# Patient Record
Sex: Male | Born: 1980 | Race: Black or African American | Hispanic: No | Marital: Single | Smoking: Current every day smoker
Health system: Southern US, Community
[De-identification: ages and names within clinical notes are randomized; demographics above are authoritative.]

## PROBLEM LIST (undated history)

## (undated) DIAGNOSIS — I2699 Other pulmonary embolism without acute cor pulmonale: Secondary | ICD-10-CM

## (undated) DIAGNOSIS — I82409 Acute embolism and thrombosis of unspecified deep veins of unspecified lower extremity: Secondary | ICD-10-CM

## (undated) HISTORY — DX: Other pulmonary embolism without acute cor pulmonale: I26.99

## (undated) HISTORY — DX: Acute embolism and thrombosis of unspecified deep veins of unspecified lower extremity: I82.409

---

## 2016-01-14 ENCOUNTER — Emergency Department: Payer: Self-pay

## 2016-01-14 ENCOUNTER — Encounter: Payer: Self-pay | Admitting: Emergency Medicine

## 2016-01-14 ENCOUNTER — Emergency Department
Admission: EM | Admit: 2016-01-14 | Discharge: 2016-01-14 | Disposition: A | Discharge status: Home / Self Care | Payer: Self-pay | Attending: Emergency Medicine | Admitting: Emergency Medicine

## 2016-01-14 DIAGNOSIS — F17200 Nicotine dependence, unspecified, uncomplicated: Secondary | ICD-10-CM | POA: Insufficient documentation

## 2016-01-14 DIAGNOSIS — G8929 Other chronic pain: Secondary | ICD-10-CM | POA: Insufficient documentation

## 2016-01-14 DIAGNOSIS — R51 Headache: Secondary | ICD-10-CM | POA: Insufficient documentation

## 2016-01-14 DIAGNOSIS — F0781 Postconcussional syndrome: Secondary | ICD-10-CM | POA: Insufficient documentation

## 2016-01-14 LAB — COMPREHENSIVE METABOLIC PANEL
ALK PHOS: 64 U/L (ref 38–126)
ALT: 15 U/L — ABNORMAL LOW (ref 17–63)
ANION GAP: 7 (ref 5–15)
AST: 19 U/L (ref 15–41)
Albumin: 4.2 g/dL (ref 3.5–5.0)
BILIRUBIN TOTAL: 0.7 mg/dL (ref 0.3–1.2)
BUN: 14 mg/dL (ref 6–20)
CALCIUM: 8.7 mg/dL — AB (ref 8.9–10.3)
CO2: 28 mmol/L (ref 22–32)
Chloride: 105 mmol/L (ref 101–111)
Creatinine, Ser: 1.39 mg/dL — ABNORMAL HIGH (ref 0.61–1.24)
GFR calc Af Amer: >60 mL/min (ref >60)
Glucose, Bld: 97 mg/dL (ref 65–99)
POTASSIUM: 3.9 mmol/L (ref 3.5–5.1)
Sodium: 140 mmol/L (ref 135–145)
TOTAL PROTEIN: 7.2 g/dL (ref 6.5–8.1)

## 2016-01-14 LAB — CBC WITH DIFFERENTIAL/PLATELET
Basophils Absolute: 0.1 10*3/uL (ref 0–0.1)
Basophils Relative: 2 %
Eosinophils Absolute: 0.2 10*3/uL (ref 0–0.7)
Eosinophils Relative: 4 %
HEMATOCRIT: 42.7 % (ref 40.0–52.0)
HEMOGLOBIN: 14.1 g/dL (ref 13.0–18.0)
LYMPHS ABS: 1.7 10*3/uL (ref 1.0–3.6)
LYMPHS PCT: 24 %
MCH: 29.1 pg (ref 26.0–34.0)
MCHC: 33.1 g/dL (ref 32.0–36.0)
MCV: 88 fL (ref 80.0–100.0)
MONO ABS: 0.5 10*3/uL (ref 0.2–1.0)
MONOS PCT: 7 %
NEUTROS ABS: 4.4 10*3/uL (ref 1.4–6.5)
Neutrophils Relative %: 63 %
Platelets: 198 10*3/uL (ref 150–440)
RBC: 4.86 MIL/uL (ref 4.40–5.90)
RDW: 12.7 % (ref 11.5–14.5)
WBC: 6.9 10*3/uL (ref 3.8–10.6)

## 2016-01-14 MED ORDER — ACETAMINOPHEN 325 MG PO TABS
650.0000 mg | ORAL_TABLET | Freq: Once | ORAL | Status: AC
  Administered 2016-01-14: 650 mg via ORAL
  Filled 2016-01-14: qty 2

## 2016-01-14 MED ORDER — SODIUM CHLORIDE 0.9 % IV BOLUS (SEPSIS)
1000.0000 mL | Freq: Once | INTRAVENOUS | Status: AC
  Administered 2016-01-14: 1000 mL via INTRAVENOUS

## 2016-01-14 MED ORDER — METOCLOPRAMIDE HCL 5 MG/ML IJ SOLN
10.0000 mg | Freq: Once | INTRAMUSCULAR | Status: AC
  Administered 2016-01-14: 10 mg via INTRAVENOUS
  Filled 2016-01-14: qty 2
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
## 2016-01-14 NOTE — ED Notes (Signed)
Pt reports headaches x3 weeks; reports frontal headache. Pt reports pain now radiates down spine. Pt denies hx of migraines.

## 2016-01-14 NOTE — ED Notes (Signed)
Pt reports that he has had headache for 3 weeks that "will let up" sometimes but has basically been constant. States that it is usually a "normal headache" (aching), and that a couple of times each hour he has sharp, knife-like pains in the top of his head. Pt has been taking aleve daily for it with some relief (dulls it some), but it continues. Pt alert & oriented. NAD noted.

## 2016-06-06 ENCOUNTER — Emergency Department: Payer: Self-pay

## 2016-06-06 ENCOUNTER — Encounter: Payer: Self-pay | Admitting: Emergency Medicine

## 2016-06-06 ENCOUNTER — Emergency Department
Admission: EM | Admit: 2016-06-06 | Discharge: 2016-06-06 | Disposition: A | Discharge status: Home / Self Care | Payer: Self-pay | Attending: Emergency Medicine | Admitting: Emergency Medicine

## 2016-06-06 DIAGNOSIS — F17200 Nicotine dependence, unspecified, uncomplicated: Secondary | ICD-10-CM | POA: Insufficient documentation

## 2016-06-06 DIAGNOSIS — X501XXA Overexertion from prolonged static or awkward postures, initial encounter: Secondary | ICD-10-CM | POA: Insufficient documentation

## 2016-06-06 DIAGNOSIS — S93402A Sprain of unspecified ligament of left ankle, initial encounter: Secondary | ICD-10-CM | POA: Insufficient documentation

## 2016-06-06 DIAGNOSIS — Y9367 Activity, basketball: Secondary | ICD-10-CM | POA: Insufficient documentation

## 2016-06-06 DIAGNOSIS — Y929 Unspecified place or not applicable: Secondary | ICD-10-CM | POA: Insufficient documentation

## 2016-06-06 DIAGNOSIS — Y998 Other external cause status: Secondary | ICD-10-CM | POA: Insufficient documentation

## 2016-06-06 MED ORDER — IBUPROFEN 600 MG PO TABS: 600.0000 mg | ORAL_TABLET | Freq: Three times a day (TID) | ORAL | 0 refills | Status: DC | PRN

## 2016-06-06 MED ORDER — HYDROCODONE-ACETAMINOPHEN 5-325 MG PO TABS: 1.0000 | ORAL_TABLET | ORAL | 0 refills | Status: DC | PRN

## 2016-06-06 MED ORDER — HYDROCODONE-ACETAMINOPHEN 5-325 MG PO TABS
1.0000 | ORAL_TABLET | Freq: Once | ORAL | Status: AC
  Administered 2016-06-06: 1 via ORAL
  Filled 2016-06-06: qty 1

## 2016-06-06 NOTE — ED Triage Notes (Signed)
Brought in via ems s/p left ankle injury  staes he rolled ankle while playing b/b

## 2016-06-06 NOTE — ED Provider Notes (Signed)
[facility] Emergency Department Provider Note  ____________________________________________  Time seen: Approximately 11:33 AM  I have reviewed the triage vital signs and the nursing notes.   HISTORY  Chief Complaint Ankle Pain   HPI Sergio Ramirez is a 35 y.o. male comes in to the emergency room today via EMS for an ankle injury that occurred yesterday. Patient states that he was able to walk after he turned his ankle over playing basketball. This morning he was unable to put any pressure on his foot without severe pain. Patient did not take any over-the-counter medications last evening or this morning.Patient denies any previous injury to his ankle. Currently he rates his pain a 5 out of 10. He denies any head injury or loss of consciousness during this event.   History reviewed. No pertinent past medical history.  There are no active problems to display for this patient.   History reviewed. No pertinent surgical history.    Allergies Review of patient's allergies indicates no known allergies.  No family history on file.  Social History Social History  Substance Use Topics  . Smoking status: Current Every Day Smoker  . Smokeless tobacco: Never Used  . Alcohol use No    Review of Systems Constitutional: No fever/chills Eyes: No visual changes. ENT: No trauma Cardiovascular: Denies chest pain. Respiratory: Denies shortness of breath. Gastrointestinal:   No nausea, no vomiting.   Musculoskeletal: Negative for back pain. Positive left ankle pain. Skin: Negative for abrasions Neurological: Negative for headaches, focal weakness or numbness.  10-point ROS otherwise negative.  ____________________________________________   PHYSICAL EXAM:  VITAL SIGNS: ED Triage Vitals  Enc Vitals Group     BP 06/06/16 1132 137/87     Pulse Rate 06/06/16 1132 99     Resp 06/06/16 1132 18     Temp 06/06/16 1132 99 F (37.2 C)     Temp Source  06/06/16 1132 Oral     SpO2 06/06/16 1132 96 %     Weight 06/06/16 1132 250 lb (113.4 kg)     Height 06/06/16 1132  (1.93 m)     Head Circumference --      Peak Flow --      Pain Score 06/06/16 1133 5     Pain Loc --      Pain Edu? --      Excl. in GC? --     Constitutional: Alert and oriented. Well appearing and in no acute distress. Eyes: Conjunctivae are normal. PERRL. EOMI. Head: Atraumatic. Nose: No congestion/rhinnorhea. Neck: No stridor.   Cardiovascular: Normal rate, regular rhythm. Grossly normal heart sounds.  Good peripheral circulation. Respiratory: Normal respiratory effort.  No retractions. Lungs CTAB. Musculoskeletal: Moves upper extremities without any difficulty. On examination of left ankle there is some soft tissue swelling on the lateral aspect and range of motion is restricted secondary to patient's discomfort. There is no abrasions or ecchymosis present. Pulses present. Motor sensory function intact. Neurologic:  Normal speech and language. No gross focal neurologic deficits are appreciated. Gait was not tested secondary to patient inability to bear weight. Skin:  Skin is warm, dry and intact. No rash noted. Psychiatric: Mood and affect are normal. Speech and behavior are normal.  ____________________________________________   LABS (all labs ordered are listed, but only abnormal results are displayed)  Labs Reviewed - No data to display  RADIOLOGY  Left ankle per radiologist shows no acute bony abnormality. ____________________________________________   PROCEDURES  Procedure(s) performed: None  Procedures  Critical Care performed: No  ____________________________________________   INITIAL IMPRESSION / ASSESSMENT AND PLAN / ED COURSE  Pertinent labs & imaging results that were available during my care of the patient were reviewed by me and considered in my medical decision making (see chart for details).    Clinical Course  Patient was  given Norco while in the emergency room since he is not driving. He is aware that he does not have fracture or dislocation of his ankle after coming back from radiology. Patient was placed in a ankle stirrup splint and given crutches. He is to follow-up with Dr. Joice Lofts if not improving in one week. He is encouraged to ice and elevate as needed for swelling. Patient does not have a PCP to follow-up with.  ____________________________________________   FINAL CLINICAL IMPRESSION(S) / ED DIAGNOSES  Final diagnoses:  Left ankle sprain, initial encounter      NEW MEDICATIONS STARTED DURING THIS VISIT:  New Prescriptions   HYDROCODONE-ACETAMINOPHEN (NORCO/VICODIN) 5-325 MG TABLET    Take 1 tablet by mouth every 4 (four) hours as needed for moderate pain.   IBUPROFEN (ADVIL,MOTRIN) 600 MG TABLET    Take 1 tablet (600 mg total) by mouth every 8 (eight) hours as needed.     Note:  This document was prepared using Dragon voice recognition software and may include unintentional dictation errors.    Tommi Rumps, PA-C 06/06/16 1228    Nita Sickle, MD 06/06/16 [phone]

## 2016-06-06 NOTE — Discharge Instructions (Signed)
Take medication only as directed. Norco was for severe pain and should be taken with food. This medication could cause drowsiness and therefore he should not be driving or operating machinery when taking this. Ibuprofen also is to be taken with food this medication will not cause drowsiness. Follow-up with Dr. Joice Lofts if any continued problems with your ankle. Ice and elevation is still recommended to reduce swelling. Use crutches when walking.

## 2017-09-02 ENCOUNTER — Emergency Department
Admission: EM | Admit: 2017-09-02 | Discharge: 2017-09-02 | Discharge status: Against Medical Advice | Payer: Self-pay | Attending: Emergency Medicine | Admitting: Emergency Medicine

## 2017-09-02 DIAGNOSIS — Z5321 Procedure and treatment not carried out due to patient leaving prior to being seen by health care provider: Secondary | ICD-10-CM | POA: Insufficient documentation

## 2017-09-02 DIAGNOSIS — R51 Headache: Secondary | ICD-10-CM | POA: Insufficient documentation

## 2017-09-02 NOTE — ED Triage Notes (Signed)
Pt states that for the past week he has been having headaches over his rt eye, pt states that he had been using alleve but now it doesn't relieve his pain, pt states that he tried to take excedrin but he vomited it up, pt states that the pain seems to be worse at night and are causing his rt eye to water, pt reports that he drove himself

## 2017-12-02 ENCOUNTER — Encounter: Payer: Self-pay | Admitting: Emergency Medicine

## 2017-12-02 ENCOUNTER — Emergency Department
Admission: EM | Admit: 2017-12-02 | Discharge: 2017-12-02 | Disposition: A | Discharge status: Home / Self Care | Payer: Self-pay | Attending: Emergency Medicine | Admitting: Emergency Medicine

## 2017-12-02 ENCOUNTER — Other Ambulatory Visit: Payer: Self-pay

## 2017-12-02 ENCOUNTER — Emergency Department: Payer: Self-pay

## 2017-12-02 DIAGNOSIS — I82432 Acute embolism and thrombosis of left popliteal vein: Secondary | ICD-10-CM | POA: Insufficient documentation

## 2017-12-02 DIAGNOSIS — F17210 Nicotine dependence, cigarettes, uncomplicated: Secondary | ICD-10-CM | POA: Insufficient documentation

## 2017-12-02 DIAGNOSIS — I824Z2 Acute embolism and thrombosis of unspecified deep veins of left distal lower extremity: Secondary | ICD-10-CM

## 2017-12-02 MED ORDER — RIVAROXABAN (XARELTO) VTE STARTER PACK (15 & 20 MG): ORAL_TABLET | ORAL | 0 refills | Status: DC

## 2017-12-02 MED ORDER — RIVAROXABAN 15 MG PO TABS
15.0000 mg | ORAL_TABLET | Freq: Once | ORAL | Status: AC
  Administered 2017-12-02: 15 mg via ORAL
  Filled 2017-12-02: qty 1

## 2017-12-02 MED ORDER — APIXABAN 5 MG PO TABS
10.0000 mg | ORAL_TABLET | Freq: Once | ORAL | Status: DC
  Filled 2017-12-02: qty 2

## 2017-12-02 NOTE — ED Provider Notes (Signed)
[facility] Emergency Department Provider Note ____________________________________________  Time seen: Approximately 9:35 PM  I have reviewed the triage vital signs and the nursing notes.   HISTORY  Chief Complaint Leg Pain    HPI Sergio CarasJonathan Ramirez is a 37 y.o. male who presents to the emergency department for evaluation and treatment of left lower extremity pain.  He denies trauma.  Pain started 1-2 weeks ago.  Pain is in the left knee, calf, and extends to the ankle.  He has intermittent swelling that does not seems to be related to activity.  He denies having similar symptoms in the past.  He denies recent travel.  He does smoke cigarettes.  He has had no recent prolonged period of immobilization or surgery.  He has no history of DVT.  History reviewed. No pertinent past medical history.  There are no active problems to display for this patient.   History reviewed. No pertinent surgical history.  Prior to Admission medications   Medication Sig Start Date End Date Taking? Authorizing Provider  HYDROcodone-acetaminophen (NORCO/VICODIN) 5-325 MG tablet Take 1 tablet by mouth every 4 (four) hours as needed for moderate pain. 06/06/16   Tommi RumpsSummers, Rhonda L, PA-C  ibuprofen (ADVIL,MOTRIN) 600 MG tablet Take 1 tablet (600 mg total) by mouth every 8 (eight) hours as needed. 06/06/16   Tommi RumpsSummers, Rhonda L, PA-C  Rivaroxaban 15 & 20 MG TBPK Take as directed on package: Start with one 15mg  tablet by mouth twice a day with food. On Day 22, switch to one 20mg  tablet once a day with food. 12/02/17   Chinita Pesterriplett, Catie Chiao B, FNP    Allergies Patient has no known allergies.  History reviewed. No pertinent family history.  Social History Social History   Tobacco Use  . Smoking status: Current Every Day Smoker    Packs/day: 0.50    Types: Cigarettes  . Smokeless tobacco: Never Used  Substance Use Topics  . Alcohol use: Yes    Comment: occasionally  . Drug use: No    Review  of Systems Constitutional: Negative for recent illness or injury. Cardiovascular: Negative for chest pain Respiratory: Positive for occasional shortness of breath only with exertion and during long conversations Musculoskeletal: Positive for left lower extremity pain Skin: Positive for intermittent swelling over the left lower extremity. Neurological: Negative for paresthesias  ____________________________________________   PHYSICAL EXAM:  VITAL SIGNS: ED Triage Vitals  Enc Vitals Group     BP 12/02/17 1826 (!) 135/91     Pulse Rate 12/02/17 1826 (!) 106     Resp 12/02/17 1826 18     Temp 12/02/17 1826 98.9 F (37.2 C)     Temp Source 12/02/17 1826 Oral     SpO2 12/02/17 1826 97 %     Weight 12/02/17 1827 250 lb (113.4 kg)     Height 12/02/17 1827 6\' 4"  (1.93 m)     Head Circumference --      Peak Flow --      Pain Score 12/02/17 1830 8     Pain Loc --      Pain Edu? --      Excl. in GC? --     Constitutional: Alert and oriented. Well appearing and in no acute distress. Eyes: Conjunctivae are clear without discharge or drainage Head: Atraumatic Neck: Supple Respiratory: Respirations even and unlabored.  Breath sounds clear to auscultation throughout. Musculoskeletal: Positive Homans sign on the left lower extremity. Neurologic: Motor and sensory function intact. Skin: Intact, no edema  of the left lower extremity appreciable on visual exam. Psychiatric: Affect and behavior are appropriate.  ____________________________________________   LABS (all labs ordered are listed, but only abnormal results are displayed)  Labs Reviewed - No data to display ____________________________________________  RADIOLOGY  Vascular ultrasound of the left lower extremity shows an occlusive thrombus of the popliteal vein as well as calf veins. ____________________________________________   PROCEDURES  Procedures  ____________________________________________   INITIAL  IMPRESSION / ASSESSMENT AND PLAN / ED COURSE  Sergio Ramirez is a 37 y.o. male who presents to the emergency department for evaluation and treatment of nontraumatic left lower extremity pain.  Ultrasound is evidence of occlusions in the calf veins as well as popliteal vein.  ----------------------------------------- 10:35 PM on 12/02/2017 -----------------------------------------  Dr. Wyn Quaker consulted and advises outpatient treatment and follow up in the office.  ----------------------------------------- 10:52 PM on 12/02/2017 -----------------------------------------  Test results and importance of adhering to the medication regimen discussed with the patient who voiced understanding. Xarelto packet given along with corresponding prescriptions. He is to call Dr. Driscilla Grammes office in the morning to schedule an appointment. Signs and symptoms of concern were discussed and he was advised to return to the ER if needed.   Medications  Rivaroxaban (XARELTO) tablet 15 mg (15 mg Oral Given 12/02/17 2250)    Pertinent labs & imaging results that were available during my care of the patient were reviewed by me and considered in my medical decision making (see chart for details).  _________________________________________   FINAL CLINICAL IMPRESSION(S) / ED DIAGNOSES  Final diagnoses:  Acute deep vein thrombosis (DVT) of popliteal vein of left lower extremity (HCC)  Acute deep vein thrombosis (DVT) of distal vein of left lower extremity [facility])    ED Discharge Orders        Ordered    Rivaroxaban 15 & 20 MG TBPK     12/02/17 2238       If controlled substance prescribed during this visit, 12 month history viewed on the [facility] prior to issuing an initial prescription for Schedule II or III opiod.    Chinita Pester, FNP 12/02/17 2256    Dionne Bucy, MD 12/02/17 2357

## 2017-12-02 NOTE — ED Triage Notes (Addendum)
Pt ambulatory to triage room c/o L lower leg pain starting 2 weeks. Pt denies known injury. Pain is posteriorly from behind knee to ankle. Reports occasional swelling. No swelling at this time. Tender to palpation. Pt states pain is worst after waking up in the morning.

## 2017-12-02 NOTE — ED Notes (Signed)
Patient transported to Ultrasound 

## 2017-12-03 ENCOUNTER — Emergency Department: Payer: Self-pay

## 2017-12-03 ENCOUNTER — Encounter: Payer: Self-pay | Admitting: Emergency Medicine

## 2017-12-03 ENCOUNTER — Other Ambulatory Visit: Payer: Self-pay

## 2017-12-03 ENCOUNTER — Inpatient Hospital Stay
Admission: EM | Admit: 2017-12-03 | Discharge: 2017-12-05 | DRG: 176 | Disposition: A | Discharge status: Home / Self Care | Payer: Self-pay | Attending: Internal Medicine | Admitting: Internal Medicine

## 2017-12-03 DIAGNOSIS — F17210 Nicotine dependence, cigarettes, uncomplicated: Secondary | ICD-10-CM | POA: Diagnosis present

## 2017-12-03 DIAGNOSIS — D649 Anemia, unspecified: Secondary | ICD-10-CM | POA: Diagnosis present

## 2017-12-03 DIAGNOSIS — I82402 Acute embolism and thrombosis of unspecified deep veins of left lower extremity: Secondary | ICD-10-CM | POA: Diagnosis present

## 2017-12-03 DIAGNOSIS — R7989 Other specified abnormal findings of blood chemistry: Secondary | ICD-10-CM | POA: Diagnosis present

## 2017-12-03 DIAGNOSIS — I2699 Other pulmonary embolism without acute cor pulmonale: Principal | ICD-10-CM | POA: Diagnosis present

## 2017-12-03 LAB — CBC WITH DIFFERENTIAL/PLATELET
Basophils Absolute: 0.1 10*3/uL (ref 0–0.1)
Basophils Relative: 1 %
EOS PCT: 1 %
Eosinophils Absolute: 0.1 10*3/uL (ref 0–0.7)
HCT: 43.2 % (ref 40.0–52.0)
Hemoglobin: 14.3 g/dL (ref 13.0–18.0)
LYMPHS ABS: 1.6 10*3/uL (ref 1.0–3.6)
Lymphocytes Relative: 17 %
MCH: 29.3 pg (ref 26.0–34.0)
MCHC: 33 g/dL (ref 32.0–36.0)
MCV: 88.9 fL (ref 80.0–100.0)
MONO ABS: 0.7 10*3/uL (ref 0.2–1.0)
MONOS PCT: 7 %
NEUTROS PCT: 74 %
Neutro Abs: 6.7 10*3/uL — ABNORMAL HIGH (ref 1.4–6.5)
PLATELETS: 313 10*3/uL (ref 150–440)
RBC: 4.86 MIL/uL (ref 4.40–5.90)
RDW: 12.7 % (ref 11.5–14.5)
WBC: 9.2 10*3/uL (ref 3.8–10.6)

## 2017-12-03 LAB — HEPARIN LEVEL (UNFRACTIONATED): HEPARIN UNFRACTIONATED: 1.41 [IU]/mL — AB (ref 0.30–0.70)

## 2017-12-03 LAB — PROTIME-INR
INR: 1.1
Prothrombin Time: 14.1 seconds (ref 11.4–15.2)

## 2017-12-03 LAB — BASIC METABOLIC PANEL
ANION GAP: 9 (ref 5–15)
BUN: 13 mg/dL (ref 6–20)
CALCIUM: 9.2 mg/dL (ref 8.9–10.3)
CO2: 26 mmol/L (ref 22–32)
Chloride: 102 mmol/L (ref 101–111)
Creatinine, Ser: 1.43 mg/dL — ABNORMAL HIGH (ref 0.61–1.24)
GLUCOSE: 102 mg/dL — AB (ref 65–99)
POTASSIUM: 4.7 mmol/L (ref 3.5–5.1)
SODIUM: 137 mmol/L (ref 135–145)

## 2017-12-03 LAB — APTT
aPTT: 38 seconds — ABNORMAL HIGH (ref 24–36)
aPTT: 53 seconds — ABNORMAL HIGH (ref 24–36)

## 2017-12-03 LAB — TROPONIN I

## 2017-12-03 MED ORDER — RIVAROXABAN 15 MG PO TABS
15.0000 mg | ORAL_TABLET | Freq: Once | ORAL | Status: AC
  Administered 2017-12-03: 15 mg via ORAL
  Filled 2017-12-03 (×2): qty 1

## 2017-12-03 MED ORDER — HEPARIN (PORCINE) IN NACL 100-0.45 UNIT/ML-% IJ SOLN
2100.0000 [IU]/h | INTRAMUSCULAR | Status: DC
  Administered 2017-12-03: 1800 [IU]/h via INTRAVENOUS
  Administered 2017-12-04: 2100 [IU]/h via INTRAVENOUS
  Administered 2017-12-04: 1900 [IU]/h via INTRAVENOUS
  Administered 2017-12-05: 2100 [IU]/h via INTRAVENOUS
  Filled 2017-12-03 (×4): qty 250

## 2017-12-03 MED ORDER — ONDANSETRON HCL 4 MG/2ML IJ SOLN: 4.0000 mg | Freq: Four times a day (QID) | INTRAMUSCULAR | Status: DC | PRN

## 2017-12-03 MED ORDER — HEPARIN (PORCINE) IN NACL 100-0.45 UNIT/ML-% IJ SOLN: 15.0000 [IU]/kg/h | INTRAMUSCULAR | Status: DC

## 2017-12-03 MED ORDER — HYDROCODONE-ACETAMINOPHEN 5-325 MG PO TABS
1.0000 | ORAL_TABLET | ORAL | Status: DC | PRN
  Administered 2017-12-03 – 2017-12-05 (×3): 1 via ORAL
  Filled 2017-12-03 (×3): qty 1

## 2017-12-03 MED ORDER — IOPAMIDOL (ISOVUE-370) INJECTION 76%
75.0000 mL | Freq: Once | INTRAVENOUS | Status: AC | PRN
  Administered 2017-12-03: 75 mL via INTRAVENOUS
  Filled 2017-12-03: qty 75

## 2017-12-03 MED ORDER — NICOTINE 14 MG/24HR TD PT24
14.0000 mg | MEDICATED_PATCH | Freq: Every day | TRANSDERMAL | Status: DC
  Administered 2017-12-03 – 2017-12-04 (×2): 14 mg via TRANSDERMAL
  Filled 2017-12-03 (×3): qty 1

## 2017-12-03 MED ORDER — HEPARIN BOLUS VIA INFUSION: 4000.0000 [IU] | Freq: Once | INTRAVENOUS | Status: DC

## 2017-12-03 MED ORDER — ACETAMINOPHEN 325 MG PO TABS: 650.0000 mg | ORAL_TABLET | Freq: Four times a day (QID) | ORAL | Status: DC | PRN

## 2017-12-03 MED ORDER — SODIUM CHLORIDE 0.9% FLUSH
3.0000 mL | Freq: Two times a day (BID) | INTRAVENOUS | Status: DC
  Administered 2017-12-03 – 2017-12-04 (×4): 3 mL via INTRAVENOUS

## 2017-12-03 MED ORDER — HYDROCODONE-ACETAMINOPHEN 5-325 MG PO TABS: 1.0000 | ORAL_TABLET | Freq: Four times a day (QID) | ORAL | 0 refills | Status: DC | PRN

## 2017-12-03 MED ORDER — SODIUM CHLORIDE 0.9 % IV SOLN: 250.0000 mL | INTRAVENOUS | Status: DC | PRN

## 2017-12-03 MED ORDER — SENNA 8.6 MG PO TABS
1.0000 | ORAL_TABLET | Freq: Two times a day (BID) | ORAL | Status: DC
  Administered 2017-12-03 – 2017-12-04 (×3): 8.6 mg via ORAL
  Filled 2017-12-03 (×5): qty 1

## 2017-12-03 MED ORDER — ONDANSETRON HCL 4 MG PO TABS: 4.0000 mg | ORAL_TABLET | Freq: Four times a day (QID) | ORAL | Status: DC | PRN

## 2017-12-03 MED ORDER — HYDROCODONE-ACETAMINOPHEN 5-325 MG PO TABS
1.0000 | ORAL_TABLET | Freq: Once | ORAL | Status: AC
  Administered 2017-12-03: 1 via ORAL
  Filled 2017-12-03: qty 1

## 2017-12-03 MED ORDER — ACETAMINOPHEN 650 MG RE SUPP: 650.0000 mg | Freq: Four times a day (QID) | RECTAL | Status: DC | PRN

## 2017-12-03 MED ORDER — POLYETHYLENE GLYCOL 3350 17 G PO PACK: 17.0000 g | PACK | Freq: Every day | ORAL | Status: DC | PRN

## 2017-12-03 MED ORDER — SODIUM CHLORIDE 0.9% FLUSH
3.0000 mL | INTRAVENOUS | Status: DC | PRN
Start: 2017-12-03 — End: 2017-12-05

## 2017-12-03 NOTE — H&P (Signed)
[facility] - [place] [facility]   PATIENT NAME: Sergio Ramirez    MR#:  [id number]  DATE OF BIRTH:  November 30, 1980  DATE OF ADMISSION:  12/03/2017  PRIMARY CARE PHYSICIAN: Patient, No Pcp Per   REQUESTING/REFERRING PHYSICIAN:   CHIEF COMPLAINT:   Chief Complaint  Patient presents with  . Leg Pain    HISTORY OF PRESENT ILLNESS: Sergio Ramirez  is a 37 y.o. male with a known history of tobacco smoking abuse/dependency presenting to the emergency room last night-diagnosed with left lower extremity DVT, sent home on Xarelto-had yet to pick up prescription and start taking, presents today with 2-week history of worsening shortness of breath, chest discomfort with deep breathing, in the emergency room patient was found to have bilateral pulmonary embolism on CT of the chest, EKG noted for diffuse ST segment elevation without P wave enlargement, creatinine 1.4, patient evaluated emergency room, patient in no apparent distress, patient is now been admitted for acute bilateral pulmonary embolism, left lower extremity DVT.    PAST MEDICAL HISTORY:  History reviewed. No pertinent past medical history.  PAST SURGICAL HISTORY: History reviewed. No pertinent surgical history.  SOCIAL HISTORY:  Social History   Tobacco Use  . Smoking status: Current Every Day Smoker    Packs/day: 0.50    Types: Cigarettes  . Smokeless tobacco: Never Used  Substance Use Topics  . Alcohol use: Yes    Comment: occasionally    FAMILY HISTORY: History reviewed. No pertinent family history.  DRUG ALLERGIES: No Known Allergies  REVIEW OF SYSTEMS:   CONSTITUTIONAL: No fever, fatigue or weakness.  EYES: No blurred or double vision.  EARS, NOSE, AND THROAT: No tinnitus or ear pain.  RESPIRATORY: No cough, shortness of breath, wheezing or hemoptysis.  CARDIOVASCULAR: No chest pain, orthopnea, edema.  GASTROINTESTINAL: No nausea, vomiting, diarrhea or abdominal pain.  GENITOURINARY: No  dysuria, hematuria.  ENDOCRINE: No polyuria, nocturia,  HEMATOLOGY: No anemia, easy bruising or bleeding SKIN: No rash or lesion. MUSCULOSKELETAL: No joint pain or arthritis.   NEUROLOGIC: No tingling, numbness, weakness.  PSYCHIATRY: No anxiety or depression.   MEDICATIONS AT HOME:  Prior to Admission medications   Medication Sig Start Date End Date Taking? Authorizing Provider  Rivaroxaban 15 & 20 MG TBPK Take as directed on package: Start with one 15mg  tablet by mouth twice a day with food. On Day 22, switch to one 20mg  tablet once a day with food. 12/02/17  Yes Triplett, Cari B, FNP  HYDROcodone-acetaminophen (NORCO/VICODIN) 5-325 MG tablet Take 1-2 tablets by mouth every 6 (six) hours as needed for moderate pain. 12/03/17   Governor Rooks, MD      PHYSICAL EXAMINATION:   VITAL SIGNS: Blood pressure (!) 151/83, pulse 92, temperature 98.6 F (37 C), temperature source Oral, SpO2 97 %.  GENERAL:  37 y.o.-year-old patient lying in the bed with no acute distress.  EYES: Pupils equal, round, reactive to light and accommodation. No scleral icterus. Extraocular muscles intact.  HEENT: Head atraumatic, normocephalic. Oropharynx and nasopharynx clear.  NECK:  Supple, no jugular venous distention. No thyroid enlargement, no tenderness.  LUNGS: Normal breath sounds bilaterally, no wheezing, rales,rhonchi or crepitation. No use of accessory muscles of respiration.  CARDIOVASCULAR: S1, S2 normal. No murmurs, rubs, or gallops.  ABDOMEN: Soft, nontender, nondistended. Bowel sounds present. No organomegaly or mass.  EXTREMITIES: Left lower extremity edema, cyanosis, or clubbing.  NEUROLOGIC: Cranial nerves II through XII are intact. Muscle strength 5/5 in all extremities. Sensation intact. Gait  not checked.  PSYCHIATRIC: The patient is alert and oriented x 3.  SKIN: No obvious rash, lesion, or ulcer.   LABORATORY PANEL:   CBC Recent Labs  Lab 12/03/17 1139  WBC 9.2  HGB 14.3  HCT 43.2  PLT  313  MCV 88.9  MCH 29.3  MCHC 33.0  RDW 12.7  LYMPHSABS 1.6  MONOABS 0.7  EOSABS 0.1  BASOSABS 0.1   ------------------------------------------------------------------------------------------------------------------  Chemistries  Recent Labs  Lab 12/03/17 1139  NA 137  K 4.7  CL 102  CO2 26  GLUCOSE 102*  BUN 13  CREATININE 1.43*  CALCIUM 9.2   ------------------------------------------------------------------------------------------------------------------ estimated creatinine clearance is 96.5 mL/min (A) (by C-G formula based on SCr of 1.43 mg/dL (H)). ------------------------------------------------------------------------------------------------------------------ No results for input(s): TSH, T4TOTAL, T3FREE, THYROIDAB in the last 72 hours.  Invalid input(s): FREET3   Coagulation profile No results for input(s): INR, PROTIME in the last 168 hours. ------------------------------------------------------------------------------------------------------------------- No results for input(s): DDIMER in the last 72 hours. -------------------------------------------------------------------------------------------------------------------  Cardiac Enzymes Recent Labs  Lab 12/03/17 1139  TROPONINI <0.03   ------------------------------------------------------------------------------------------------------------------ Invalid input(s): POCBNP  ---------------------------------------------------------------------------------------------------------------  Urinalysis No results found for: COLORURINE, APPEARANCEUR, LABSPEC, PHURINE, GLUCOSEU, HGBUR, BILIRUBINUR, KETONESUR, PROTEINUR, UROBILINOGEN, NITRITE, LEUKOCYTESUR   RADIOLOGY: Ct Angio Chest Pe W/cm &/or Wo Cm  Result Date: 12/03/2017 CLINICAL DATA:  Increasing left lower extremity pain. Shortness of breath. EXAM: CT ANGIOGRAPHY CHEST WITH CONTRAST TECHNIQUE: Multidetector CT imaging of the chest was performed using  the standard protocol during bolus administration of intravenous contrast. Multiplanar CT image reconstructions and MIPs were obtained to evaluate the vascular anatomy. CONTRAST:  75mL ISOVUE-370 IOPAMIDOL (ISOVUE-370) INJECTION 76% COMPARISON:  None. FINDINGS: Cardiovascular: Satisfactory opacification of the pulmonary arteries to the segmental level. Pulmonary embolus in the right and left main pulmonary arteries. Pulmonary emboli involving bilateral lower lobar arteries extending into the segmental branches. Pulmonary emboli in the left upper lobar arteries. Pulmonary emboli in the right middle lobe segmental pulmonary arteries. Normal heart size. No pericardial effusion. Normal caliber thoracic aorta. Mediastinum/Nodes: No enlarged mediastinal, hilar, or axillary lymph nodes. Thyroid gland, trachea, and esophagus demonstrate no significant findings. Lungs/Pleura: Lungs are clear. No pleural effusion or pneumothorax. Upper Abdomen: No acute abnormality. Musculoskeletal: No chest wall abnormality. No acute or significant osseous findings. Review of the MIP images confirms the above findings. IMPRESSION: 1. Acute bilateral pulmonary emboli as described above. Critical Value/emergent results were called by telephone at the time of interpretation on 12/03/2017 at 12:41 pm to Dr. Governor RooksEBECCA LORD , who verbally acknowledged these results. Electronically Signed   By: Elige KoHetal  Patel   On: 12/03/2017 12:42   [place] Venous Img Lower Unilateral Left  Result Date: 12/03/2017 CLINICAL DATA:  Bilateral pulmonary emboli. Left leg pain for 1 week. EXAM: LEFT LOWER EXTREMITY VENOUS DOPPLER ULTRASOUND TECHNIQUE: Gray-scale sonography with graded compression, as well as color Doppler and duplex ultrasound were performed to evaluate the lower extremity deep venous systems from the level of the common femoral vein and including the common femoral, femoral, profunda femoral, popliteal and calf veins including the posterior tibial, peroneal  and gastrocnemius veins when visible. The superficial great saphenous vein was also interrogated. Spectral Doppler was utilized to evaluate flow at rest and with distal augmentation maneuvers in the common femoral, femoral and popliteal veins. COMPARISON:  12/02/2017. FINDINGS: Contralateral Common Femoral Vein: Respiratory phasicity is normal and symmetric with the symptomatic side. No evidence of thrombus. Normal compressibility. Common Femoral Vein: No evidence of thrombus. Normal compressibility, respiratory phasicity and  response to augmentation. Saphenofemoral Junction: No evidence of thrombus. Normal compressibility and flow on color Doppler imaging. Profunda Femoral Vein: No evidence of thrombus. Normal compressibility and flow on color Doppler imaging. Femoral Vein: No evidence of thrombus. Normal compressibility and flow on color Doppler imaging. Popliteal Vein: There is acute thrombus and occlusion noted within the left popliteal vein. No compressibility or augmentation. Calf Veins: There is acute thrombus noted within the posterior tibial and peroneal veins of the left calf. No compressibility or augmentation. Superficial Great Saphenous Vein: No evidence of thrombus. Normal compressibility. Venous Reflux:  None. Other Findings:  None. IMPRESSION: Deep venous thrombosis noted in the left popliteal vein and calf veins, unchanged since yesterday's study. Electronically Signed   By: Charlett Nose M.D.   On: 12/03/2017 13:45   US Venous Img Lower Unilateral Left  Result Date: 12/02/2017 CLINICAL DATA:  Left calf pain for 1 week with edema EXAM: Left LOWER EXTREMITY VENOUS DOPPLER ULTRASOUND TECHNIQUE: Gray-scale sonography with graded compression, as well as color Doppler and duplex ultrasound were performed to evaluate the lower extremity deep venous systems from the level of the common femoral vein and including the common femoral, femoral, profunda femoral, popliteal and calf veins including the  posterior tibial, peroneal and gastrocnemius veins when visible. The superficial great saphenous vein was also interrogated. Spectral Doppler was utilized to evaluate flow at rest and with distal augmentation maneuvers in the common femoral, femoral and popliteal veins. COMPARISON:  None. FINDINGS: Contralateral Common Femoral Vein: Respiratory phasicity is normal and symmetric with the symptomatic side. No evidence of thrombus. Normal compressibility. Common Femoral Vein: No evidence of thrombus. Normal compressibility, respiratory phasicity and response to augmentation. Saphenofemoral Junction: No evidence of thrombus. Normal compressibility and flow on color Doppler imaging. Profunda Femoral Vein: No evidence of thrombus. Normal compressibility and flow on color Doppler imaging. Femoral Vein: No evidence of thrombus. Normal compressibility, respiratory phasicity and response to augmentation. Popliteal Vein: Occlusive thrombus.  Noncompressible. Calf Veins: Occlusive thrombus present. Vessels are noncompressible. Other Findings:  None. IMPRESSION: Acute occlusive left lower extremity DVT involving the calf veins and extending to the popliteal vein. Critical Value/emergent results were called by telephone at the time of interpretation on 12/02/2017 at 9:08 pm to Dr. Kem Boroughs , who verbally acknowledged these results. Electronically Signed   By: Jasmine Pang M.D.   On: 12/02/2017 21:08    EKG: Orders placed or performed during the hospital encounter of 12/03/17  . ED EKG  . ED EKG  . EKG 12-Lead  . EKG 12-Lead    IMPRESSION AND PLAN: 1 acute bilateral pulmonary emboli Admit to regular nursing floor bed, continue heparin drip which was started in the emergency room, check echocardiogram to evaluate for right heart strain, supplemental oxygen as needed, adult pain protocol, and continue close medical monitoring  2 acute left lower extremity DVT Plan of care as stated above  3 chronic tobacco  smoking abuse/dependency Nicotine patch and cessation counseling ordered  All the records are reviewed and case discussed with ED provider. Management plans discussed with the patient, family and they are in agreement.  CODE STATUS: Code Status History    This patient does not have a recorded code status. Please follow your organizational policy for patients in this situation.       TOTAL TIME TAKING CARE OF THIS PATIENT: 35 minutes.    Evelena Asa Shandreka Dante M.D on 12/03/2017   Between 7am to 6pm - Pager - [phone]  After 6pm  go to [email or web address] - password [email or web address] [facility]  Office  [phone]  CC: Primary care physician; Patient, No Pcp Per   Note: This dictation was prepared with Dragon dictation along with smaller phrase technology. Any transcriptional errors that result from this process are unintentional.

## 2017-12-03 NOTE — ED Provider Notes (Signed)
[facility] Emergency Department Provider Note ____________________________________________   I have reviewed the triage vital signs and the triage nursing note.  HISTORY  Chief Complaint Leg Pain   Historian Patient  HPI Sergio Ramirez Below is a 37 y.o. male presenting with worsening left lower leg pain since yesterday when he was diagnosed by ultrasound with popliteal and calf vein DVT.  Patient states symptoms started a couple of weeks ago, he is also had for about 2 weeks some shortness of breath which he thought was probably bronchitis.  He does not typically take an inhaler.  States that pain in the leg was probably about a 7 or 8 yesterday by documentation, 10 today.  He got a dose of Xarelto in the ER yesterday, has not filled this prescription yet.  He is feeling some shortness of breath when he takes a deep breath he has some pain at the right lower rib cage which is somewhat pleuritic in nature.   History reviewed. No pertinent past medical history.  There are no active problems to display for this patient.   History reviewed. No pertinent surgical history.  Prior to Admission medications   Medication Sig Start Date End Date Taking? Authorizing Provider  HYDROcodone-acetaminophen (NORCO/VICODIN) 5-325 MG tablet Take 1-2 tablets by mouth every 6 (six) hours as needed for moderate pain. 12/03/17   Governor RooksLord, Taraya Steward, MD  Rivaroxaban 15 & 20 MG TBPK Take as directed on package: Start with one 15mg  tablet by mouth twice a day with food. On Day 22, switch to one 20mg  tablet once a day with food. 12/02/17   Triplett, Rulon Eisenmengerari B, FNP    No Known Allergies  History reviewed. No pertinent family history.  Social History Social History   Tobacco Use  . Smoking status: Current Every Day Smoker    Packs/day: 0.50    Types: Cigarettes  . Smokeless tobacco: Never Used  Substance Use Topics  . Alcohol use: Yes    Comment: occasionally  . Drug use: No     Review of Systems  Constitutional: Negative for fever. Eyes: Negative for visual changes. ENT: Negative for sore throat. Cardiovascular: Positive for pleuritic chest pain on the right side. Respiratory: Positive for shortness of breath. Gastrointestinal: Negative for abdominal pain, vomiting and diarrhea. Genitourinary: Negative for dysuria. Musculoskeletal: Negative for back pain.  Positive for left leg pain.   Skin: Negative for rash. Neurological: Negative for headache.  ____________________________________________   PHYSICAL EXAM:  VITAL SIGNS: ED Triage Vitals  Enc Vitals Group     BP 12/03/17 1042 (!) 151/83     Pulse Rate 12/03/17 1042 92     Resp --      Temp 12/03/17 1042 98.6 F (37 C)     Temp Source 12/03/17 1042 Oral     SpO2 12/03/17 1042 97 %     Weight --      Height --      Head Circumference --      Peak Flow --      Pain Score 12/03/17 1045 10     Pain Loc --      Pain Edu? --      Excl. in GC? --      Constitutional: Alert and oriented. Well appearing and in no distress. HEENT   Head: Normocephalic and atraumatic.      Eyes: Conjunctivae are normal. Pupils equal and round.       Ears:         Nose:  No congestion/rhinnorhea.   Mouth/Throat: Mucous membranes are moist.   Neck: No stridor. Cardiovascular/Chest: Normal rate, regular rhythm.  No murmurs, rubs, or gallops. Respiratory: Normal respiratory effort without tachypnea nor retractions. Breath sounds are clear and equal bilaterally. No wheezes/rales/rhonchi. Gastrointestinal: Soft. No distention, no guarding, no rebound. Nontender.    Genitourinary/rectal:Deferred Musculoskeletal: Nontender with normal range of motion in all extremities. No joint effusions.  No lower extremity edema.  He does have tenderness to the posterior thigh as well as posterior calf on the left. Neurologic:  Normal speech and language. No gross or focal neurologic deficits are appreciated. Skin:  Skin  is warm, dry and intact. No rash noted. Psychiatric: Mood and affect are normal. Speech and behavior are normal. Patient exhibits appropriate insight and judgment.   ____________________________________________  LABS (pertinent positives/negatives) I, Governor Rooks, MD the attending physician have reviewed the labs noted below.  Labs Reviewed  BASIC METABOLIC PANEL - Abnormal; Notable for the following components:      Result Value   Glucose, Bld 102 (*)    Creatinine, Ser 1.43 (*)    All other components within normal limits  CBC WITH DIFFERENTIAL/PLATELET - Abnormal; Notable for the following components:   Neutro Abs 6.7 (*)    All other components within normal limits  TROPONIN I    ____________________________________________    EKG I, Governor Rooks, MD, the attending physician have personally viewed and interpreted all ECGs.  84 bpm.  Normal sinus rhythm.  Narrow QRS.  Normal axis.  Up scooping T wave inferiorly. ____________________________________________  RADIOLOGY All Xrays were viewed by me.  Imaging interpreted by Radiologist, and I, Governor Rooks, MD the attending physician have reviewed the radiologist interpretation noted below.  CT chest for PE:  FINDINGS: Cardiovascular: Satisfactory opacification of the pulmonary arteries to the segmental level. Pulmonary embolus in the right and left main pulmonary arteries. Pulmonary emboli involving bilateral lower lobar arteries extending into the segmental branches. Pulmonary emboli in the left upper lobar arteries. Pulmonary emboli in the right middle lobe segmental pulmonary arteries. Normal heart size. No pericardial effusion. Normal caliber thoracic aorta.  Mediastinum/Nodes: No enlarged mediastinal, hilar, or axillary lymph nodes. Thyroid gland, trachea, and esophagus demonstrate no significant findings.  Lungs/Pleura: Lungs are clear. No pleural effusion or pneumothorax.  Upper Abdomen: No acute  abnormality.  Musculoskeletal: No chest wall abnormality. No acute or significant osseous findings.  Review of the MIP images confirms the above findings.  IMPRESSION: 1. Acute bilateral pulmonary emboli as described above.  Critical Value/emergent results were called by telephone at the time of interpretation on 12/03/2017 at 12:41 pm to Dr. Governor Rooks , who verbally acknowledged these results.  Ultrasound right lower extremity: Pending __________________________________________  PROCEDURES  Procedure(s) performed: None  Critical Care performed: None   ____________________________________________  ED COURSE / ASSESSMENT AND PLAN  Pertinent labs & imaging results that were available during my care of the patient were reviewed by me and considered in my medical decision making (see chart for details).  Patient presents for worsening left lower leg pain where he was found to have an acute DVT yesterday and started on Xarelto although is not had his next dose and has been just about 12 hours already.  Will give today's dose.  He is also getting some symptoms which are a little concerning for PE.  We discussed obtaining a repeat ultrasound to make sure there is no worsening clot burden, and also checking CT to rule out pulmonary embolism.  There is no evidence for cellulitis.  No evidence for gout.  Discussed result of multiple bilateral PE with radiologist.  Given multiple bilateral main pulm artery PE, will admit.  I spoke with Dr. Donneta Romberg - recommends starting heparin drip, but hold heparin bolus given xarelto dose this AM.  DIFFERENTIAL DIAGNOSIS: Including but not limited to cellulitis, gout, joint effusion, other arthritis, myalgias, bronchitis, pneumonia, pulmonary embolism, etc.  CONSULTATIONS:   Dr. Donneta Romberg, Heme/onc.  Hospitalist for admission.   Patient / Family / Caregiver informed of clinical course, medical decision-making process, and agree with  plan.   ___________________________________________   FINAL CLINICAL IMPRESSION(S) / ED DIAGNOSES   Final diagnoses:  Deep vein thrombosis (DVT) of left lower extremity, unspecified chronicity, unspecified vein (HCC)  Bilateral pulmonary embolism (HCC)      ___________________________________________        Note: This dictation was prepared with Dragon dictation. Any transcriptional errors that result from this process are unintentional    Governor Rooks, MD 12/03/17 1253

## 2017-12-03 NOTE — ED Triage Notes (Signed)
FIRST NURSE NOTE-here last night and dx with blood clot. Woke up this morning and pain was worse in leg.

## 2017-12-03 NOTE — Progress Notes (Addendum)
ANTICOAGULATION CONSULT NOTE - Initial Consult  Pharmacy Consult for Heparin Indication: pulmonary embolus  No Known Allergies  Patient Measurements:   Heparin Dosing Weight: 110 kg  Vital Signs: Temp: 98.6 F (37 C) (01/21 1042) Temp Source: Oral (01/21 1042) BP: 151/83 (01/21 1042) Pulse Rate: 92 (01/21 1042)  Labs: Recent Labs    12/03/17 1139  HGB 14.3  HCT 43.2  PLT 313  CREATININE 1.43*  TROPONINI <0.03    Estimated Creatinine Clearance: 96.5 mL/min (A) (by C-G formula based on SCr of 1.43 mg/dL (H)).   Medical History: History reviewed. No pertinent past medical history.    Assessment: 37 yo male who started rivaroxaban for DVT yesterday (didn't fill prescription yet) now found to have PE. Per consult, no bolus, just drip. Pt received last dose of rivaroxaban 15 mg this AM at 1050 in the ED.   Baseline labs (aPTT, INR, HL) ordered Hgb 14.3, plt 313 - WNL  Goal of Therapy:  APTT 66-102s Heparin level 0.3-0.7 units/ml Monitor platelets by anticoagulation protocol: Yes   Plan:  No heparin bolus Start heparin drip at 1800 units/hr (=18/ ml/hr) APTT level in 6h after start of drip CBC and heparin level in AM  Asked ED RN to draw baseline labs (aPTT, INR, HL) before hanging heparin drip.   Pharmacy will continue to follow.   Crist FatWang, Keishawn Rajewski L 12/03/2017,1:25 PM

## 2017-12-03 NOTE — ED Triage Notes (Signed)
Pt with increasing LLE pain. Pt with known DVT left.

## 2017-12-03 NOTE — Progress Notes (Addendum)
ANTICOAGULATION CONSULT NOTE - Initial Consult  Pharmacy Consult for Heparin Indication: pulmonary embolus  No Known Allergies  Patient Measurements: Height: 6\' 4"  (193 cm) Weight: 263 lb 11.2 oz (119.6 kg) IBW/kg (Calculated) : 86.8 Heparin Dosing Weight: 110 kg  Vital Signs: Temp: 99.5 F (37.5 C) (01/21 1944) Temp Source: Oral (01/21 1944) BP: 138/86 (01/21 1944) Pulse Rate: 84 (01/21 1944)  Labs: Recent Labs    12/03/17 1139 12/03/17 1329 12/03/17 2015  HGB 14.3  --   --   HCT 43.2  --   --   PLT 313  --   --   APTT  --  38* 53*  LABPROT  --  14.1  --   INR  --  1.10  --   HEPARINUNFRC  --  1.41*  --   CREATININE 1.43*  --   --   TROPONINI <0.03  --   --     Estimated Creatinine Clearance: 99 mL/min (A) (by C-G formula based on SCr of 1.43 mg/dL (H)).   Medical History: History reviewed. No pertinent past medical history.    Assessment: 37 yo male who started rivaroxaban for DVT yesterday (didn't fill prescription yet) now found to have PE. Per consult, no bolus, just drip. Pt received last dose of rivaroxaban 15 mg this AM at 1050 in the ED.   Baseline labs (aPTT, INR, HL) ordered Hgb 14.3, plt 313 - WNL  Goal of Therapy:  APTT 66-102s Heparin level 0.3-0.7 units/ml Monitor platelets by anticoagulation protocol: Yes   Plan:  No heparin bolus Start heparin drip at 1800 units/hr (=18/ ml/hr) APTT level in 6h after start of drip CBC and heparin level in AM  Asked ED RN to draw baseline labs (aPTT, INR, HL) before hanging heparin drip.   Pharmacy will continue to follow.   12/03/17 2015 aPTT subtherapeutic x 1. Increase to 1900 units/hr. Will recheck aPTT in 6 hours.  Carola FrostNathan A Cookson, Pharm.D., BCPS Clinical Pharmacist 12/03/2017,9:11 PM   01/22 0300 heparin level 0.91, aPTT 66. Continue current regimen. Recheck heparin level, aPTT, and CBC with tomorrow AM labs.  Fulton ReekMatt Jia Dottavio, PharmD, BCPS  12/04/17 3:43 AM

## 2017-12-03 NOTE — ED Notes (Signed)
Pt with clear BS, c/o SOB, sats 97%.  2+ pedal pulses. Left calf pain 10/10

## 2017-12-04 DIAGNOSIS — Z7901 Long term (current) use of anticoagulants: Secondary | ICD-10-CM

## 2017-12-04 DIAGNOSIS — D649 Anemia, unspecified: Secondary | ICD-10-CM

## 2017-12-04 DIAGNOSIS — I82432 Acute embolism and thrombosis of left popliteal vein: Secondary | ICD-10-CM

## 2017-12-04 DIAGNOSIS — I2699 Other pulmonary embolism without acute cor pulmonale: Principal | ICD-10-CM

## 2017-12-04 DIAGNOSIS — I82402 Acute embolism and thrombosis of unspecified deep veins of left lower extremity: Secondary | ICD-10-CM

## 2017-12-04 LAB — CBC
HCT: 38.9 % — ABNORMAL LOW (ref 40.0–52.0)
HEMOGLOBIN: 12.7 g/dL — AB (ref 13.0–18.0)
MCH: 29.4 pg (ref 26.0–34.0)
MCHC: 32.8 g/dL (ref 32.0–36.0)
MCV: 89.7 fL (ref 80.0–100.0)
Platelets: 290 10*3/uL (ref 150–440)
RBC: 4.34 MIL/uL — ABNORMAL LOW (ref 4.40–5.90)
RDW: 12.7 % (ref 11.5–14.5)
WBC: 7.7 10*3/uL (ref 3.8–10.6)

## 2017-12-04 LAB — APTT
APTT: 65 s — AB (ref 24–36)
APTT: 75 s — AB (ref 24–36)
aPTT: 66 seconds — ABNORMAL HIGH (ref 24–36)

## 2017-12-04 LAB — HEPARIN LEVEL (UNFRACTIONATED): HEPARIN UNFRACTIONATED: 0.91 [IU]/mL — AB (ref 0.30–0.70)

## 2017-12-04 NOTE — Progress Notes (Signed)
ANTICOAGULATION CONSULT NOTE - Initial Consult  Pharmacy Consult for Heparin Indication: pulmonary embolus  No Known Allergies  Patient Measurements: Height: 6\' 4"  (193 cm) Weight: 263 lb 11.2 oz (119.6 kg) IBW/kg (Calculated) : 86.8 Heparin Dosing Weight: 110 kg  Vital Signs: Temp: 98.8 F (37.1 C) (01/22 0502) Temp Source: Oral (01/22 0502) BP: 138/88 (01/22 0502) Pulse Rate: 70 (01/22 0502)  Labs: Recent Labs    12/03/17 1139  12/03/17 1329 12/03/17 2015 12/04/17 0306 12/04/17 1211  HGB 14.3  --   --   --  12.7*  --   HCT 43.2  --   --   --  38.9*  --   PLT 313  --   --   --  290  --   APTT  --    < > 38* 53* 66* 65*  LABPROT  --   --  14.1  --   --   --   INR  --   --  1.10  --   --   --   HEPARINUNFRC  --   --  1.41*  --  0.91*  --   CREATININE 1.43*  --   --   --   --   --   TROPONINI <0.03  --   --   --   --   --    < > = values in this interval not displayed.    Estimated Creatinine Clearance: 99 mL/min (A) (by C-G formula based on SCr of 1.43 mg/dL (H)).   Medical History: History reviewed. No pertinent past medical history.    Assessment: 37 yo male who started rivaroxaban for DVT yesterday (didn't fill prescription yet) now found to have PE. Per consult, no bolus, just drip. Pt received last dose of rivaroxaban 15 mg this AM at 1050 in the ED.   Baseline labs (aPTT, INR, HL) ordered Hgb 14.3, plt 313 - WNL  Goal of Therapy:  APTT 66-102s Heparin level 0.3-0.7 units/ml Monitor platelets by anticoagulation protocol: Yes   Plan:  No heparin bolus Start heparin drip at 1800 units/hr (=18/ ml/hr) APTT level in 6h after start of drip CBC and heparin level in AM  Asked ED RN to draw baseline labs (aPTT, INR, HL) before hanging heparin drip.   Pharmacy will continue to follow.   12/03/17 2015 aPTT subtherapeutic x 1. Increase to 1900 units/hr. Will recheck aPTT in 6 hours.  Luisa Harthristy, Malaney Mcbean D, Pharm.D., BCPS Clinical Pharmacist 12/04/2017,4:17  PM   01/22 0300 heparin level 0.91, aPTT 66. Continue current regimen. Recheck heparin level, aPTT, and CBC with tomorrow AM labs.  01/22 @ 1600 aPTT 65. Will increase heparin infusion to 2100 units/hr and recheck aPTT in 6 hours.   Luisa HartScott Sylvan Sookdeo, PharmD Clinical Pharmacist  12/04/17 4:17 PM

## 2017-12-04 NOTE — Progress Notes (Signed)
[facility] - [place] [facility]   PATIENT NAME: Sergio Ramirez    MR#:  [id number]  DATE OF BIRTH:  03/24/1981  SUBJECTIVE:   Patient came in with increasing shortness of breath and chest tightness with breathing.  Was found to have bilateral pulmonary embolism.  Denies any recent illness, surgery.  He is asymptomatic at present.  Just a little bit of shortness of breath on exertion.  Wants to take shower. REVIEW OF SYSTEMS:   Review of Systems  Constitutional: Negative for chills, fever and weight loss.  HENT: Negative for ear discharge, ear pain and nosebleeds.   Eyes: Negative for blurred vision, pain and discharge.  Respiratory: Negative for sputum production, shortness of breath, wheezing and stridor.   Cardiovascular: Positive for leg swelling. Negative for chest pain, palpitations, orthopnea and PND.  Gastrointestinal: Negative for abdominal pain, diarrhea, nausea and vomiting.  Genitourinary: Negative for frequency and urgency.  Musculoskeletal: Negative for back pain and joint pain.  Neurological: Positive for weakness. Negative for sensory change, speech change and focal weakness.  Psychiatric/Behavioral: Negative for depression and hallucinations. The patient is not nervous/anxious.    Tolerating Diet:yes Tolerating PT: not needed  DRUG ALLERGIES:  No Known Allergies  VITALS:  Blood pressure 138/88, pulse 70, temperature 98.8 F (37.1 C), temperature source Oral, resp. rate 20, height 6\' 4"  (1.93 m), weight 119.6 kg (263 lb 11.2 oz), SpO2 99 %.  PHYSICAL EXAMINATION:   Physical Exam  GENERAL:  37 y.o.-year-old patient lying in the bed with no acute distress.  EYES: Pupils equal, round, reactive to light and accommodation. No scleral icterus. Extraocular muscles intact.  HEENT: Head atraumatic, normocephalic. Oropharynx and nasopharynx clear.  NECK:  Supple, no jugular venous distention. No thyroid enlargement, no tenderness.  LUNGS:  Normal breath sounds bilaterally, no wheezing, rales, rhonchi. No use of accessory muscles of respiration.  CARDIOVASCULAR: S1, S2 normal. No murmurs, rubs, or gallops.  ABDOMEN: Soft, nontender, nondistended. Bowel sounds present. No organomegaly or mass.  EXTREMITIES: No cyanosis, clubbing or edema b/l.    NEUROLOGIC: Cranial nerves II through XII are intact. No focal Motor or sensory deficits b/l.   PSYCHIATRIC:  patient is alert and oriented x 3.  SKIN: No obvious rash, lesion, or ulcer.   LABORATORY PANEL:  CBC Recent Labs  Lab 12/04/17 0306  WBC 7.7  HGB 12.7*  HCT 38.9*  PLT 290    Chemistries  Recent Labs  Lab 12/03/17 1139  NA 137  K 4.7  CL 102  CO2 26  GLUCOSE 102*  BUN 13  CREATININE 1.43*  CALCIUM 9.2   Cardiac Enzymes Recent Labs  Lab 12/03/17 1139  TROPONINI <0.03   RADIOLOGY:  Ct Angio Chest Pe W/cm &/or Wo Cm  Result Date: 12/03/2017 CLINICAL DATA:  Increasing left lower extremity pain. Shortness of breath. EXAM: CT ANGIOGRAPHY CHEST WITH CONTRAST TECHNIQUE: Multidetector CT imaging of the chest was performed using the standard protocol during bolus administration of intravenous contrast. Multiplanar CT image reconstructions and MIPs were obtained to evaluate the vascular anatomy. CONTRAST:  75mL ISOVUE-370 IOPAMIDOL (ISOVUE-370) INJECTION 76% COMPARISON:  None. FINDINGS: Cardiovascular: Satisfactory opacification of the pulmonary arteries to the segmental level. Pulmonary embolus in the right and left main pulmonary arteries. Pulmonary emboli involving bilateral lower lobar arteries extending into the segmental branches. Pulmonary emboli in the left upper lobar arteries. Pulmonary emboli in the right middle lobe segmental pulmonary arteries. Normal heart size. No pericardial effusion. Normal caliber thoracic aorta.  Mediastinum/Nodes: No enlarged mediastinal, hilar, or axillary lymph nodes. Thyroid gland, trachea, and esophagus demonstrate no significant  findings. Lungs/Pleura: Lungs are clear. No pleural effusion or pneumothorax. Upper Abdomen: No acute abnormality. Musculoskeletal: No chest wall abnormality. No acute or significant osseous findings. Review of the MIP images confirms the above findings. IMPRESSION: 1. Acute bilateral pulmonary emboli as described above. Critical Value/emergent results were called by telephone at the time of interpretation on 12/03/2017 at 12:41 pm to Dr. Governor Rooks , who verbally acknowledged these results. Electronically Signed   By: Elige Ko   On: 12/03/2017 12:42   US Venous Img Lower Unilateral Left  Result Date: 12/03/2017 CLINICAL DATA:  Bilateral pulmonary emboli. Left leg pain for 1 week. EXAM: LEFT LOWER EXTREMITY VENOUS DOPPLER ULTRASOUND TECHNIQUE: Gray-scale sonography with graded compression, as well as color Doppler and duplex ultrasound were performed to evaluate the lower extremity deep venous systems from the level of the common femoral vein and including the common femoral, femoral, profunda femoral, popliteal and calf veins including the posterior tibial, peroneal and gastrocnemius veins when visible. The superficial great saphenous vein was also interrogated. Spectral Doppler was utilized to evaluate flow at rest and with distal augmentation maneuvers in the common femoral, femoral and popliteal veins. COMPARISON:  12/02/2017. FINDINGS: Contralateral Common Femoral Vein: Respiratory phasicity is normal and symmetric with the symptomatic side. No evidence of thrombus. Normal compressibility. Common Femoral Vein: No evidence of thrombus. Normal compressibility, respiratory phasicity and response to augmentation. Saphenofemoral Junction: No evidence of thrombus. Normal compressibility and flow on color Doppler imaging. Profunda Femoral Vein: No evidence of thrombus. Normal compressibility and flow on color Doppler imaging. Femoral Vein: No evidence of thrombus. Normal compressibility and flow on color  Doppler imaging. Popliteal Vein: There is acute thrombus and occlusion noted within the left popliteal vein. No compressibility or augmentation. Calf Veins: There is acute thrombus noted within the posterior tibial and peroneal veins of the left calf. No compressibility or augmentation. Superficial Great Saphenous Vein: No evidence of thrombus. Normal compressibility. Venous Reflux:  None. Other Findings:  None. IMPRESSION: Deep venous thrombosis noted in the left popliteal vein and calf veins, unchanged since yesterday's study. Electronically Signed   By: Charlett Nose M.D.   On: 12/03/2017 13:45   US Venous Img Lower Unilateral Left  Result Date: 12/02/2017 CLINICAL DATA:  Left calf pain for 1 week with edema EXAM: Left LOWER EXTREMITY VENOUS DOPPLER ULTRASOUND TECHNIQUE: Gray-scale sonography with graded compression, as well as color Doppler and duplex ultrasound were performed to evaluate the lower extremity deep venous systems from the level of the common femoral vein and including the common femoral, femoral, profunda femoral, popliteal and calf veins including the posterior tibial, peroneal and gastrocnemius veins when visible. The superficial great saphenous vein was also interrogated. Spectral Doppler was utilized to evaluate flow at rest and with distal augmentation maneuvers in the common femoral, femoral and popliteal veins. COMPARISON:  None. FINDINGS: Contralateral Common Femoral Vein: Respiratory phasicity is normal and symmetric with the symptomatic side. No evidence of thrombus. Normal compressibility. Common Femoral Vein: No evidence of thrombus. Normal compressibility, respiratory phasicity and response to augmentation. Saphenofemoral Junction: No evidence of thrombus. Normal compressibility and flow on color Doppler imaging. Profunda Femoral Vein: No evidence of thrombus. Normal compressibility and flow on color Doppler imaging. Femoral Vein: No evidence of thrombus. Normal compressibility,  respiratory phasicity and response to augmentation. Popliteal Vein: Occlusive thrombus.  Noncompressible. Calf Veins: Occlusive thrombus present. Vessels are noncompressible.  Other Findings:  None. IMPRESSION: Acute occlusive left lower extremity DVT involving the calf veins and extending to the popliteal vein. Critical Value/emergent results were called by telephone at the time of interpretation on 12/02/2017 at 9:08 pm to Dr. Kem Boroughs , who verbally acknowledged these results. Electronically Signed   By: Jasmine Pang M.D.   On: 12/02/2017 21:08   ASSESSMENT AND PLAN:  Sergio Ramirez  is a 37 y.o. male with a known history of tobacco smoking abuse/dependency presenting to the emergency room last night-diagnosed with left lower extremity DVT, sent home on Xarelto-had yet to pick up prescription and start taking, presents today with 2-week history of worsening shortness of breath, chest discomfort with deep breathing, in the emergency room patient was found to have bilateral pulmonary embolism on CT of the chest   1 acute bilateral pulmonary emboli - continue heparin drip which was started in the emergency room - check echocardiogram to evaluate for right heart strain, supplemental oxygen as needed -DVT has been unprovoked.  No coagulopathy workup was sent prior to starting heparin.  Patient remembers grandmother having blood clots do not know further details.   -Consult oncology for above.  Patient will benefit from coagulopathy workup as outpatient   2 acute left lower extremity DVT Plan of care as stated above  3 chronic tobacco smoking abuse/dependency Nicotine patch and cessation counseling ordered  Care management for discharge planning help with blood thinners  Case discussed with Care Management/Social Worker. Management plans discussed with the patient, family and they are in agreement.  CODE STATUS: Full  DVT Prophylaxis: Heparin therapy  TOTAL TIME TAKING CARE OF THIS  PATIENT: *30* minutes.  >50% time spent on counselling and coordination of care  POSSIBLE D/C IN *1-2* DAYS, DEPENDING ON CLINICAL CONDITION.  Note: This dictation was prepared with Dragon dictation along with smaller phrase technology. Any transcriptional errors that result from this process are unintentional.  Enedina Finner M.D on 12/04/2017 at 1:38 PM  Between 7am to 6pm - Pager - [phone]  After 6pm go to [email or web address] - password [email or web address]  [facility]  Office  [phone]  CC: Primary care physician; Patient, No Pcp PerPatient ID: Sergio Ramirez, male   DOB: 01-27-1981, 37 y.o.   MRN: [id number]

## 2017-12-04 NOTE — Consult Note (Signed)
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  He presented to emergency room on 12/02/2017 complaining left lower extremity pain. He tells me that he's been noticed left lower extremity swelling and the pain for about a week and half. Pain is swelling get worse so he presented to emergency room. He was found to have a left lower extremity DVT and he was given 1 dose of Xarelto and sent home with prescription. Patient also reports some shortness of breath at the time. CT chest wasn't done. Patient was sent home and came back to emergency room the next morning because of worsening of leg pain. He has not filled his Xarelto prescription yet. Is also feeling more shortness of breath, and intermittent chest pain and he takes a deep breath. He had a CT scan PE protocol which showed PE. Patient denies any previous history of DVT or PE, denies any leg injury, recent surgery, long-distance CAR ride or flight.  Occasionally he notices blood in the stool which he attributes to hemorrhoids. He reports having sweating yesterday.   Review of systems- Review of Systems  Constitutional: Negative for chills and fever.  HENT: Negative for ear pain and hearing loss.   Eyes: Negative for blurred vision.  Respiratory: Negative for cough.   Cardiovascular: Negative for chest pain.  Gastrointestinal: Negative for heartburn.  Genitourinary: Negative for dysuria.  Skin: Negative for rash.  Neurological: Negative for dizziness.  Endo/Heme/Allergies: Does not bruise/bleed easily.    Psychiatric/Behavioral: Negative for depression.    No Known Allergies  Patient Active Problem List   Diagnosis Date Noted  . Pulmonary embolus (HCC) 12/03/2017     History reviewed. No pertinent past medical history.   History reviewed. No pertinent surgical history.  Social History   Socioeconomic History  . Marital status: Single    Spouse name: Not on file  . Number of children: Not on file  . Years of education: Not on file  . Highest education level: Not on file  Social Needs  . Financial resource strain: Not on file  . Food insecurity - worry: Not on file  . Food insecurity - inability: Not on file  . Transportation needs - medical: Not on file  . Transportation needs - non-medical: Not on file  Occupational History  . Not on file  Tobacco Use  . Smoking status: Current Every Day Smoker    Packs/day: 0.50    Types: Cigarettes  . Smokeless tobacco: Never Used  Substance and Sexual Activity  . Alcohol use: Yes    Alcohol/week: 13.2 oz    Types: 12 Cans of beer, 10 Shots of liquor per week    Comment: occasionally  . Drug use: No  . Sexual activity: Yes    Birth control/protection: Condom  Other Topics Concern  . Not on file  Social History Narrative  . Not on file     History reviewed. No pertinent family history.   Current Facility-Administered Medications:  .  0.9 %  sodium chloride infusion, 250 mL, Intravenous, PRN, Salary, Montell D, MD .  acetaminophen (TYLENOL) tablet 650 mg,  650 mg, Oral, Q6H PRN **OR** acetaminophen (TYLENOL) suppository 650 mg, 650 mg, Rectal, Q6H PRN, Salary, Montell D, MD .  heparin ADULT infusion 100 units/mL (25000 units/262mL sodium chloride 0.45%), 2,100 Units/hr, Intravenous, Continuous, Enedina Finner, MD, Last Rate: 21 mL/hr at 12/04/17 1525, 2,100 Units/hr at 12/04/17 1525 .  HYDROcodone-acetaminophen (NORCO/VICODIN) 5-325 MG per tablet 1-2 tablet, 1-2 tablet, Oral, Q4H PRN, Salary, Montell D, MD, 1 tablet at 12/03/17  2100 .  nicotine (NICODERM CQ - dosed in mg/24 hours) patch 14 mg, 14 mg, Transdermal, Daily, Salary, Montell D, MD, 14 mg at 12/04/17 0906 .  ondansetron (ZOFRAN) tablet 4 mg, 4 mg, Oral, Q6H PRN **OR** ondansetron (ZOFRAN) injection 4 mg, 4 mg, Intravenous, Q6H PRN, Salary, Montell D, MD .  polyethylene glycol (MIRALAX / GLYCOLAX) packet 17 g, 17 g, Oral, Daily PRN, Salary, Montell D, MD .  senna (SENOKOT) tablet 8.6 mg, 1 tablet, Oral, BID, Salary, Montell D, MD, 8.6 mg at 12/04/17 0906 .  sodium chloride flush (NS) 0.9 % injection 3 mL, 3 mL, Intravenous, Q12H, Salary, Montell D, MD, 3 mL at 12/04/17 1021 .  sodium chloride flush (NS) 0.9 % injection 3 mL, 3 mL, Intravenous, Q12H, Salary, Montell D, MD, 3 mL at 12/04/17 1021 .  sodium chloride flush (NS) 0.9 % injection 3 mL, 3 mL, Intravenous, PRN, Angelina Ok D, MD   Physical exam:  Vitals:   12/03/17 1547 12/03/17 1800 12/03/17 1944 12/04/17 0502  BP: 123/69  138/86 138/88  Pulse: 90  84 70  Resp: 18  20 20   Temp: 98.2 F (36.8 C)  99.5 F (37.5 C) 98.8 F (37.1 C)  TempSrc: Oral  Oral Oral  SpO2: 99%  100% 99%  Weight:  263 lb 11.2 oz (119.6 kg)    Height:  6\' 4"  (1.93 m)     GENERAL:Alert, no distress and comfortable.  EYES: no pallor or icterus OROPHARYNX: no thrush or ulceration; good dentition  NECK: supple, no masses felt LYMPH:  no palpable lymphadenopathy in the cervical, axillary or inguinal regions LUNGS: clear to auscultation and  No wheeze or crackles HEART/CVS: regular rate & rhythm and no murmurs; No lower extremity edema ABDOMEN: abdomen soft, non-tender and normal bowel sounds Musculoskeletal: Left lower extremity warm to touch. Calf tenderness.  PSYCH: alert & oriented x 3  NEURO: no focal motor/sensory deficits SKIN:  no rashes or significant lesions     CMP Latest Ref Rng & Units 12/03/2017  Glucose 65 - 99 mg/dL 960(A)  BUN 6 - 20 mg/dL 13  Creatinine 5.40 - 9.81 mg/dL 1.91(Y)  Sodium 782 -  145 mmol/L 137  Potassium 3.5 - 5.1 mmol/L 4.7  Chloride 101 - 111 mmol/L 102  CO2 22 - 32 mmol/L 26  Calcium 8.9 - 10.3 mg/dL 9.2  Total Protein 6.5 - 8.1 g/dL -  Total Bilirubin 0.3 - 1.2 mg/dL -  Alkaline Phos 38 - 956 U/L -  AST 15 - 41 U/L -  ALT 17 - 63 U/L -   CBC Latest Ref Rng & Units 12/04/2017  WBC 3.8 - 10.6 K/uL 7.7  Hemoglobin 13.0 - 18.0 g/dL 12.7(L)  Hematocrit 40.0 - 52.0 % 38.9(L)  Platelets 150 - 440 K/uL 290    Ct Angio Chest Pe W/cm &/or Wo Cm  Result Date: 12/03/2017 CLINICAL DATA:  Increasing left lower extremity pain. Shortness of breath. EXAM: CT ANGIOGRAPHY CHEST WITH CONTRAST TECHNIQUE: Multidetector CT imaging of the chest was performed using the standard  protocol during bolus administration of intravenous contrast. Multiplanar CT image reconstructions and MIPs were obtained to evaluate the vascular anatomy. CONTRAST:  75mL ISOVUE-370 IOPAMIDOL (ISOVUE-370) INJECTION 76% COMPARISON:  None. FINDINGS: Cardiovascular: Satisfactory opacification of the pulmonary arteries to the segmental level. Pulmonary embolus in the right and left main pulmonary arteries. Pulmonary emboli involving bilateral lower lobar arteries extending into the segmental branches. Pulmonary emboli in the left upper lobar arteries. Pulmonary emboli in the right middle lobe segmental pulmonary arteries. Normal heart size. No pericardial effusion. Normal caliber thoracic aorta. Mediastinum/Nodes: No enlarged mediastinal, hilar, or axillary lymph nodes. Thyroid gland, trachea, and esophagus demonstrate no significant findings. Lungs/Pleura: Lungs are clear. No pleural effusion or pneumothorax. Upper Abdomen: No acute abnormality. Musculoskeletal: No chest wall abnormality. No acute or significant osseous findings. Review of the MIP images confirms the above findings. IMPRESSION: 1. Acute bilateral pulmonary emboli as described above. Critical Value/emergent results were called by telephone at the time of  interpretation on 12/03/2017 at 12:41 pm to Dr. Governor Rooks , who verbally acknowledged these results. Electronically Signed   By: Elige Ko   On: 12/03/2017 12:42   US Venous Img Lower Unilateral Left  Result Date: 12/03/2017 CLINICAL DATA:  Bilateral pulmonary emboli. Left leg pain for 1 week. EXAM: LEFT LOWER EXTREMITY VENOUS DOPPLER ULTRASOUND TECHNIQUE: Gray-scale sonography with graded compression, as well as color Doppler and duplex ultrasound were performed to evaluate the lower extremity deep venous systems from the level of the common femoral vein and including the common femoral, femoral, profunda femoral, popliteal and calf veins including the posterior tibial, peroneal and gastrocnemius veins when visible. The superficial great saphenous vein was also interrogated. Spectral Doppler was utilized to evaluate flow at rest and with distal augmentation maneuvers in the common femoral, femoral and popliteal veins. COMPARISON:  12/02/2017. FINDINGS: Contralateral Common Femoral Vein: Respiratory phasicity is normal and symmetric with the symptomatic side. No evidence of thrombus. Normal compressibility. Common Femoral Vein: No evidence of thrombus. Normal compressibility, respiratory phasicity and response to augmentation. Saphenofemoral Junction: No evidence of thrombus. Normal compressibility and flow on color Doppler imaging. Profunda Femoral Vein: No evidence of thrombus. Normal compressibility and flow on color Doppler imaging. Femoral Vein: No evidence of thrombus. Normal compressibility and flow on color Doppler imaging. Popliteal Vein: There is acute thrombus and occlusion noted within the left popliteal vein. No compressibility or augmentation. Calf Veins: There is acute thrombus noted within the posterior tibial and peroneal veins of the left calf. No compressibility or augmentation. Superficial Great Saphenous Vein: No evidence of thrombus. Normal compressibility. Venous Reflux:  None. Other  Findings:  None. IMPRESSION: Deep venous thrombosis noted in the left popliteal vein and calf veins, unchanged since yesterday's study. Electronically Signed   By: Charlett Nose M.D.   On: 12/03/2017 13:45   US Venous Img Lower Unilateral Left  Result Date: 12/02/2017 CLINICAL DATA:  Left calf pain for 1 week with edema EXAM: Left LOWER EXTREMITY VENOUS DOPPLER ULTRASOUND TECHNIQUE: Gray-scale sonography with graded compression, as well as color Doppler and duplex ultrasound were performed to evaluate the lower extremity deep venous systems from the level of the common femoral vein and including the common femoral, femoral, profunda femoral, popliteal and calf veins including the posterior tibial, peroneal and gastrocnemius veins when visible. The superficial great saphenous vein was also interrogated. Spectral Doppler was utilized to evaluate flow at rest and with distal augmentation maneuvers in the common femoral, femoral and popliteal veins. COMPARISON:  None. FINDINGS:  Contralateral Common Femoral Vein: Respiratory phasicity is normal and symmetric with the symptomatic side. No evidence of thrombus. Normal compressibility. Common Femoral Vein: No evidence of thrombus. Normal compressibility, respiratory phasicity and response to augmentation. Saphenofemoral Junction: No evidence of thrombus. Normal compressibility and flow on color Doppler imaging. Profunda Femoral Vein: No evidence of thrombus. Normal compressibility and flow on color Doppler imaging. Femoral Vein: No evidence of thrombus. Normal compressibility, respiratory phasicity and response to augmentation. Popliteal Vein: Occlusive thrombus.  Noncompressible. Calf Veins: Occlusive thrombus present. Vessels are noncompressible. Other Findings:  None. IMPRESSION: Acute occlusive left lower extremity DVT involving the calf veins and extending to the popliteal vein. Critical Value/emergent results were called by telephone at the time of interpretation  on 12/02/2017 at 9:08 pm to Dr. Kem BoroughsARI TRIPLETT , who verbally acknowledged these results. Electronically Signed   By: Jasmine PangKim  Fujinaga M.D.   On: 12/02/2017 21:08    Assessment and plan- Patient is a 37 y.o. male  Who presents with left popliteal DVT and bilateral PE.   # Unprovoked DVT and bilateral PE.   I agree with heparin drip. Although patient was started on Xarelto in the emergency room after left lower extremity DVT was diagnosed, and a PE was discovered after Xarelto use, the patient has had shortness of breath during his first ER visit and probably already had PE at that time.  I would repeat his kidney function and if kidney function is normal;, would switch him to oral Xarelto 15 mg twice a day for 21 days and then 20 mg daily.  He can follow up with me outpatient for hyper carpal workup including factor V Leiden mutation and prothrombin mutation. I discussed with patient that as this is an unprovoked episode, he needs long-term anticoagulation if tolerating. Patient voices understanding and all questions answered. Check LFT in AM as well.   # Elevated creatinine: repeat BMP in AM.   #  Normocytic anemia; repeat CBC in AM. 2 patient reports history of seen blood in the stool occasionally and thought this is due to his hemorrhoids. No recent episodes. I advise him if seeing blood in stool, will refer to GI for colonoscopy.    Thank  you for this kind referral and the opportunity to participate in the care of this patient  Rickard PatienceZhou Elaisha Zahniser, MD, PhD Hematology Oncology [facility] Pager- [phone] 12/04/2017

## 2017-12-04 NOTE — Care Management (Signed)
RNCM confirmed with [place] [facility] that Medication Management has Xarelto in stock for discharge.

## 2017-12-04 NOTE — Progress Notes (Addendum)
ANTICOAGULATION CONSULT NOTE - Initial Consult  Pharmacy Consult for Heparin Indication: pulmonary embolus  No Known Allergies  Patient Measurements: Height: 6\' 4"  (193 cm) Weight: 263 lb 11.2 oz (119.6 kg) IBW/kg (Calculated) : 86.8 Heparin Dosing Weight: 110 kg  Vital Signs: Temp: 98.2 F (36.8 C) (01/22 2049) Temp Source: Oral (01/22 2049) BP: 128/72 (01/22 2049) Pulse Rate: 88 (01/22 2049)  Labs: Recent Labs    12/03/17 1139 12/03/17 1329  12/04/17 0306 12/04/17 1211 12/04/17 2114  HGB 14.3  --   --  12.7*  --   --   HCT 43.2  --   --  38.9*  --   --   PLT 313  --   --  290  --   --   APTT  --  38*   < > 66* 65* 75*  LABPROT  --  14.1  --   --   --   --   INR  --  1.10  --   --   --   --   HEPARINUNFRC  --  1.41*  --  0.91*  --   --   CREATININE 1.43*  --   --   --   --   --   TROPONINI <0.03  --   --   --   --   --    < > = values in this interval not displayed.    Estimated Creatinine Clearance: 99 mL/min (A) (by C-G formula based on SCr of 1.43 mg/dL (H)).   Medical History: History reviewed. No pertinent past medical history.    Assessment: 37 yo male who started rivaroxaban for DVT yesterday (didn't fill prescription yet) now found to have PE. Per consult, no bolus, just drip. Pt received last dose of rivaroxaban 15 mg this AM at 1050 in the ED.   Baseline labs (aPTT, INR, HL) ordered Hgb 14.3, plt 313 - WNL  Goal of Therapy:  APTT 66-102s Heparin level 0.3-0.7 units/ml Monitor platelets by anticoagulation protocol: Yes   Plan:  No heparin bolus Start heparin drip at 1800 units/hr (=18/ ml/hr) APTT level in 6h after start of drip CBC and heparin level in AM  Asked ED RN to draw baseline labs (aPTT, INR, HL) before hanging heparin drip.   Pharmacy will continue to follow.   12/03/17 2015 aPTT subtherapeutic x 1. Increase to 1900 units/hr. Will recheck aPTT in 6 hours.  Robbins,Jason D, Pharm.D., BCPS Clinical Pharmacist 12/04/2017,10:10  PM   01/22 0300 heparin level 0.91, aPTT 66. Continue current regimen. Recheck heparin level, aPTT, and CBC with tomorrow AM labs.  01/22 @ 1600 aPTT 65. Will increase heparin infusion to 2100 units/hr and recheck aPTT in 6 hours.   01/22 @ 21:14 = 75.   Will continue this pt on current rate and recheck aPTT on 1/23 with AM labs.   1/23 AM aPTT 75, heparin level 0.57. Continue current regimen. Recheck heparin level, aPTT and CBC with tomorrow AM labs.  Robbins,Jason D Clinical Pharmacist  12/04/17 10:10 PM

## 2017-12-05 LAB — BASIC METABOLIC PANEL
Anion gap: 10 (ref 5–15)
BUN: 13 mg/dL (ref 6–20)
CALCIUM: 8.9 mg/dL (ref 8.9–10.3)
CO2: 27 mmol/L (ref 22–32)
CREATININE: 1.26 mg/dL — AB (ref 0.61–1.24)
Chloride: 102 mmol/L (ref 101–111)
Glucose, Bld: 104 mg/dL — ABNORMAL HIGH (ref 65–99)
Potassium: 3.8 mmol/L (ref 3.5–5.1)
SODIUM: 139 mmol/L (ref 135–145)

## 2017-12-05 LAB — CBC
HCT: 41.1 % (ref 40.0–52.0)
Hemoglobin: 13.6 g/dL (ref 13.0–18.0)
MCH: 29.7 pg (ref 26.0–34.0)
MCHC: 33.2 g/dL (ref 32.0–36.0)
MCV: 89.4 fL (ref 80.0–100.0)
PLATELETS: 323 10*3/uL (ref 150–440)
RBC: 4.59 MIL/uL (ref 4.40–5.90)
RDW: 12.6 % (ref 11.5–14.5)
WBC: 6.9 10*3/uL (ref 3.8–10.6)

## 2017-12-05 LAB — HEPATIC FUNCTION PANEL
ALBUMIN: 3.6 g/dL (ref 3.5–5.0)
ALT: 12 U/L — ABNORMAL LOW (ref 17–63)
AST: 16 U/L (ref 15–41)
Alkaline Phosphatase: 63 U/L (ref 38–126)
BILIRUBIN TOTAL: 0.7 mg/dL (ref 0.3–1.2)
Bilirubin, Direct: <0.1 mg/dL — ABNORMAL LOW (ref 0.1–0.5)
TOTAL PROTEIN: 7.2 g/dL (ref 6.5–8.1)

## 2017-12-05 LAB — HIV ANTIBODY (ROUTINE TESTING W REFLEX): HIV Screen 4th Generation wRfx: NONREACTIVE

## 2017-12-05 LAB — HEPARIN LEVEL (UNFRACTIONATED): Heparin Unfractionated: 0.57 IU/mL (ref 0.30–0.70)

## 2017-12-05 LAB — APTT: APTT: 75 s — AB (ref 24–36)

## 2017-12-05 MED ORDER — RIVAROXABAN 15 MG PO TABS
15.0000 mg | ORAL_TABLET | Freq: Two times a day (BID) | ORAL | Status: DC
  Administered 2017-12-05: 15 mg via ORAL
  Filled 2017-12-05 (×2): qty 1

## 2017-12-05 MED ORDER — RIVAROXABAN 20 MG PO TABS: 20.0000 mg | ORAL_TABLET | Freq: Every day | ORAL | Status: DC

## 2017-12-05 NOTE — Progress Notes (Signed)
Patient was concerned about the swelling and pain in his leg and the fact that it is "moving".  Dr Allena KatzPatel notified and no new orders received

## 2017-12-05 NOTE — Discharge Summary (Signed)
[facility] [place] at [facility]   PATIENT NAME: Sergio Ramirez    MR#:  [id number]  DATE OF BIRTH:  27-Jan-1981  DATE OF ADMISSION:  12/03/2017 ADMITTING PHYSICIAN: Evelena Asa Salary, MD  DATE OF DISCHARGE: 12/05/2017  PRIMARY CARE PHYSICIAN: Patient, No Pcp Per    ADMISSION DIAGNOSIS:  Bilateral pulmonary embolism (HCC) [I26.99] Deep vein thrombosis (DVT) of left lower extremity, unspecified chronicity, unspecified vein (HCC) [I82.402]  DISCHARGE DIAGNOSIS:  Acute Bilateral PE and Left LE DVT--Unprovoked  SECONDARY DIAGNOSIS:  History reviewed. No pertinent past medical history.  HOSPITAL COURSE:  Sergio Ramirez a36 y.o.malewith a known history oftobacco smoking abuse/dependency presenting to the emergency room last night-diagnosed with left lower extremity DVT, sent home on Xarelto-had yet to pick up prescription and start taking, presents today with 2-week history of worsening shortness of breath, chest discomfort with deep breathing, in the emergency room patient was found to have bilateral pulmonary embolism on CT of the chest  1acute bilateral pulmonary emboli - ON IV heparin drip which was started in the emergency room---change to Po xarelto -pt has been set up with medication management clinic to get his Xarelto by CM - sats 100% on RA -DVT has been unprovoked.  No coagulopathy workup was sent prior to starting heparin.  Patient remembers grandmother having blood clots do not know further details.   -Oncology consult with DR Cathie Hoops appreciated--  Patient will get coagulopathy workup as outpatient   2acute left lower extremity DVT Plan of care as stated above  3chronic tobacco smoking abuse/dependency Nicotine patch and cessation counseling ordered  D/c home  CONSULTS OBTAINED:  Treatment Team:  Rickard Patience, MD  DRUG ALLERGIES:  No Known Allergies  DISCHARGE MEDICATIONS:   Allergies as of 12/05/2017   No Known Allergies      Medication List    STOP taking these medications   ibuprofen 600 MG tablet Commonly known as:  ADVIL,MOTRIN     TAKE these medications   HYDROcodone-acetaminophen 5-325 MG tablet Commonly known as:  NORCO/VICODIN Take 1-2 tablets by mouth every 6 (six) hours as needed for moderate pain. What changed:    how much to take  when to take this   Rivaroxaban 15 & 20 MG Tbpk Take as directed on package: Start with one 15mg  tablet by mouth twice a day with food. On Day 22, switch to one 20mg  tablet once a day with food.       If you experience worsening of your admission symptoms, develop shortness of breath, life threatening emergency, suicidal or homicidal thoughts you must seek medical attention immediately by calling 911 or calling your MD immediately  if symptoms less severe.  You Must read complete instructions/literature along with all the possible adverse reactions/side effects for all the Medicines you take and that have been prescribed to you. Take any new Medicines after you have completely understood and accept all the possible adverse reactions/side effects.   Please note  You were cared for by a hospitalist during your hospital stay. If you have any questions about your discharge medications or the care you received while you were in the hospital after you are discharged, you can call the unit and asked to speak with the hospitalist on call if the hospitalist that took care of you is not available. Once you are discharged, your primary care physician will handle any further medical issues. Please note that NO REFILLS for any discharge medications will be authorized once you are discharged,  as it is imperative that you return to your primary care physician (or establish a relationship with a primary care physician if you do not have one) for your aftercare needs so that they can reassess your need for medications and monitor your lab values. Today   SUBJECTIVE   Doing  well  VITAL SIGNS:  Blood pressure (!) 148/79, pulse 77, temperature 98.4 F (36.9 C), temperature source Oral, resp. rate 17, height 6\' 4"  (1.93 m), weight 119.6 kg (263 lb 11.2 oz), SpO2 100 %.  I/O:    Intake/Output Summary (Last 24 hours) at 12/05/2017 0951 Last data filed at 12/05/2017 0530 Gross per 24 hour  Intake 985 ml  Output 400 ml  Net 585 ml    PHYSICAL EXAMINATION:  GENERAL:  37 y.o.-year-old patient lying in the bed with no acute distress.  EYES: Pupils equal, round, reactive to light and accommodation. No scleral icterus. Extraocular muscles intact.  HEENT: Head atraumatic, normocephalic. Oropharynx and nasopharynx clear.  NECK:  Supple, no jugular venous distention. No thyroid enlargement, no tenderness.  LUNGS: Normal breath sounds bilaterally, no wheezing, rales,rhonchi or crepitation. No use of accessory muscles of respiration.  CARDIOVASCULAR: S1, S2 normal. No murmurs, rubs, or gallops.  ABDOMEN: Soft, non-tender, non-distended. Bowel sounds present. No organomegaly or mass.  EXTREMITIES: No pedal edema, cyanosis, or clubbing.  NEUROLOGIC: Cranial nerves II through XII are intact. Muscle strength 5/5 in all extremities. Sensation intact. Gait not checked.  PSYCHIATRIC: The patient is alert and oriented x 3.  SKIN: No obvious rash, lesion, or ulcer.   DATA REVIEW:   CBC  Recent Labs  Lab 12/05/17 0432  WBC 6.9  HGB 13.6  HCT 41.1  PLT 323    Chemistries  Recent Labs  Lab 12/05/17 0432  NA 139  K 3.8  CL 102  CO2 27  GLUCOSE 104*  BUN 13  CREATININE 1.26*  CALCIUM 8.9  AST 16  ALT 12*  ALKPHOS 63  BILITOT 0.7    Microbiology Results   No results found for this or any previous visit (from the past 240 hour(s)).  RADIOLOGY:  Ct Angio Chest Pe W/cm &/or Wo Cm  Result Date: 12/03/2017 CLINICAL DATA:  Increasing left lower extremity pain. Shortness of breath. EXAM: CT ANGIOGRAPHY CHEST WITH CONTRAST TECHNIQUE: Multidetector CT imaging of  the chest was performed using the standard protocol during bolus administration of intravenous contrast. Multiplanar CT image reconstructions and MIPs were obtained to evaluate the vascular anatomy. CONTRAST:  75mL ISOVUE-370 IOPAMIDOL (ISOVUE-370) INJECTION 76% COMPARISON:  None. FINDINGS: Cardiovascular: Satisfactory opacification of the pulmonary arteries to the segmental level. Pulmonary embolus in the right and left main pulmonary arteries. Pulmonary emboli involving bilateral lower lobar arteries extending into the segmental branches. Pulmonary emboli in the left upper lobar arteries. Pulmonary emboli in the right middle lobe segmental pulmonary arteries. Normal heart size. No pericardial effusion. Normal caliber thoracic aorta. Mediastinum/Nodes: No enlarged mediastinal, hilar, or axillary lymph nodes. Thyroid gland, trachea, and esophagus demonstrate no significant findings. Lungs/Pleura: Lungs are clear. No pleural effusion or pneumothorax. Upper Abdomen: No acute abnormality. Musculoskeletal: No chest wall abnormality. No acute or significant osseous findings. Review of the MIP images confirms the above findings. IMPRESSION: 1. Acute bilateral pulmonary emboli as described above. Critical Value/emergent results were called by telephone at the time of interpretation on 12/03/2017 at 12:41 pm to Dr. Governor Rooks , who verbally acknowledged these results. Electronically Signed   By: Elige Ko   On: 12/03/2017  12:42   [place] Venous Img Lower Unilateral Left  Result Date: 12/03/2017 CLINICAL DATA:  Bilateral pulmonary emboli. Left leg pain for 1 week. EXAM: LEFT LOWER EXTREMITY VENOUS DOPPLER ULTRASOUND TECHNIQUE: Gray-scale sonography with graded compression, as well as color Doppler and duplex ultrasound were performed to evaluate the lower extremity deep venous systems from the level of the common femoral vein and including the common femoral, femoral, profunda femoral, popliteal and calf veins including  the posterior tibial, peroneal and gastrocnemius veins when visible. The superficial great saphenous vein was also interrogated. Spectral Doppler was utilized to evaluate flow at rest and with distal augmentation maneuvers in the common femoral, femoral and popliteal veins. COMPARISON:  12/02/2017. FINDINGS: Contralateral Common Femoral Vein: Respiratory phasicity is normal and symmetric with the symptomatic side. No evidence of thrombus. Normal compressibility. Common Femoral Vein: No evidence of thrombus. Normal compressibility, respiratory phasicity and response to augmentation. Saphenofemoral Junction: No evidence of thrombus. Normal compressibility and flow on color Doppler imaging. Profunda Femoral Vein: No evidence of thrombus. Normal compressibility and flow on color Doppler imaging. Femoral Vein: No evidence of thrombus. Normal compressibility and flow on color Doppler imaging. Popliteal Vein: There is acute thrombus and occlusion noted within the left popliteal vein. No compressibility or augmentation. Calf Veins: There is acute thrombus noted within the posterior tibial and peroneal veins of the left calf. No compressibility or augmentation. Superficial Great Saphenous Vein: No evidence of thrombus. Normal compressibility. Venous Reflux:  None. Other Findings:  None. IMPRESSION: Deep venous thrombosis noted in the left popliteal vein and calf veins, unchanged since yesterday's study. Electronically Signed   By: Charlett NoseKevin  Dover M.D.   On: 12/03/2017 13:45     Management plans discussed with the patient, family and they are in agreement.  CODE STATUS:     Code Status Orders  (From admission, onward)        Start     Ordered   12/03/17 1536  Full code  Continuous     12/03/17 1535    Code Status History    Date Active Date Inactive Code Status Order ID Comments User Context   This patient has a current code status but no historical code status.      TOTAL TIME TAKING CARE OF THIS  PATIENT: 40 minutes.    Enedina FinnerSona Lisette Mancebo M.D on 12/05/2017 at 9:51 AM  Between 7am to 6pm - Pager - [phone] After 6pm go to [email or web address] - password [email or web address]  [facility] [place] [facility]  Office  [phone]  CC: Primary care physician; Patient, No Pcp Per

## 2017-12-05 NOTE — Discharge Instructions (Signed)
Return to emergency department immediately for any worsening pain, numbness, tingling, trouble breathing, fever, heart racing, dizziness or passing out, skin rash, or any other symptoms concerning to you.  Information on my medicine - XARELTO (rivaroxaban)  This medication education was reviewed with me or my healthcare representative as part of my discharge preparation.  The pharmacist that spoke with me during my hospital stay was:  Valentina GuChristy, Sajjad Honea D, [facility]  WHY WAS [name] PRESCRIBED FOR YOU? Xarelto was prescribed to treat blood clots that may have been found in the veins of your legs (deep vein thrombosis) or in your lungs (pulmonary embolism) and to reduce the risk of them occurring again.  What do you need to know about Xarelto? The starting dose is one 15 mg tablet taken TWICE daily with food for the FIRST 21 DAYS then on (enter date)  12/26/17  the dose is changed to one 20 mg tablet taken ONCE A DAY with your evening meal.  DO NOT stop taking Xarelto without talking to the health care provider who prescribed the medication.  Refill your prescription for 20 mg tablets before you run out.  After discharge, you should have regular check-up appointments with your healthcare provider that is prescribing your Xarelto.  In the future your dose may need to be changed if your kidney function changes by a significant amount.  What do you do if you miss a dose? If you are taking Xarelto TWICE DAILY and you miss a dose, take it as soon as you remember. You may take two 15 mg tablets (total 30 mg) at the same time then resume your regularly scheduled 15 mg twice daily the next day.  If you are taking Xarelto ONCE DAILY and you miss a dose, take it as soon as you remember on the same day then continue your regularly scheduled once daily regimen the next day. Do not take two doses of Xarelto at the same time.   Important Safety Information Xarelto is a blood thinner medicine that can cause  bleeding. You should call your healthcare provider right away if you experience any of the following: ? Bleeding from an injury or your nose that does not stop. ? Unusual colored urine (red or dark brown) or unusual colored stools (red or black). ? Unusual bruising for unknown reasons. ? A serious fall or if you hit your head (even if there is no bleeding).  Some medicines may interact with Xarelto and might increase your risk of bleeding while on Xarelto. To help avoid this, consult your healthcare provider or pharmacist prior to using any new prescription or non-prescription medications, including herbals, vitamins, non-steroidal anti-inflammatory drugs (NSAIDs) and supplements.  This website has more information on Xarelto: [email or web address].

## 2017-12-05 NOTE — Care Management Note (Signed)
Case Management Note  Patient Details  Name: Sergio CarasJonathan Ramirez MRN: [id number] Date of Birth: Apr 21, 1981   Patient to discharge today on Xarelto.  Patient does not have PCP, and is self pay.  Patient [facility], [facility] , and "The Network:  Your Guide to [employer] [facility] [employer] [place]"  Booklet.  Script with 2 refills faxed to Medication Management.  Patient to pick up after discharge.  RNCM stressed to patient that he must complete the application, and [facility] in [facility] to be able to continue to receive his medications.  Patient in agreement.  Patient also provided 30 day free coupon for Xarelto.  Patient states that [facility]  RNCM informed patient that getting plugged in to Medication would be best for a long term plam versus [facility]  Patient in agreement.  RNCM signing off  Subjective/Objective:                    Action/Plan:   Expected Discharge Date:  12/05/17               Expected Discharge Plan:  Home/Self Care  In-House Referral:     Discharge planning Services  CM Consult, Indigent Health Clinic, Medication Assistance  Post Acute Care Choice:    Choice offered to:     DME Arranged:    DME Agency:     HH Arranged:    HH Agency:     Status of Service:  Completed, signed off  If discussed at [employer] Meetings, dates discussed:    Additional Comments:  Chapman FitchBOWEN, Brandley Aldrete T, RN 12/05/2017, 10:33 AM

## 2017-12-05 NOTE — Progress Notes (Signed)
Discharge instructions reviewed and the patient.  He has a rx for xarelto and  Norco.  Patient is being sent down via wheelchair to his waiting ride

## 2017-12-14 ENCOUNTER — Emergency Department: Admission: EM | Admit: 2017-12-14 | Discharge: 2017-12-14 | Discharge status: Against Medical Advice | Payer: MEDICAID

## 2017-12-14 NOTE — ED Notes (Addendum)
Pt ambulatory without difficulty or distress noted, STAT registered then immediately left lobby stating he "needed to go park his vehicle"

## 2017-12-14 NOTE — ED Notes (Addendum)
Pt never returned to lobby; attempted to call pt's phone several times, no answer; triage nurse walked to parking lot and pt not seen

## 2017-12-25 ENCOUNTER — Other Ambulatory Visit: Payer: Self-pay

## 2017-12-25 ENCOUNTER — Encounter: Payer: Self-pay | Admitting: Oncology

## 2017-12-25 ENCOUNTER — Inpatient Hospital Stay: Payer: Self-pay

## 2017-12-25 ENCOUNTER — Inpatient Hospital Stay: Payer: Self-pay | Attending: Oncology | Admitting: Oncology

## 2017-12-25 VITALS — BP 130/79 | HR 83 | Temp 96.3°F | Wt 264.2 lb

## 2017-12-25 DIAGNOSIS — K921 Melena: Secondary | ICD-10-CM | POA: Insufficient documentation

## 2017-12-25 DIAGNOSIS — Z7901 Long term (current) use of anticoagulants: Secondary | ICD-10-CM | POA: Insufficient documentation

## 2017-12-25 DIAGNOSIS — I82432 Acute embolism and thrombosis of left popliteal vein: Secondary | ICD-10-CM

## 2017-12-25 DIAGNOSIS — I82402 Acute embolism and thrombosis of unspecified deep veins of left lower extremity: Secondary | ICD-10-CM | POA: Insufficient documentation

## 2017-12-25 DIAGNOSIS — I2699 Other pulmonary embolism without acute cor pulmonale: Secondary | ICD-10-CM | POA: Insufficient documentation

## 2017-12-25 LAB — COMPREHENSIVE METABOLIC PANEL
ALBUMIN: 4 g/dL (ref 3.5–5.0)
ALT: 13 U/L — ABNORMAL LOW (ref 17–63)
ANION GAP: 5 (ref 5–15)
AST: 18 U/L (ref 15–41)
Alkaline Phosphatase: 64 U/L (ref 38–126)
BUN: 14 mg/dL (ref 6–20)
CO2: 27 mmol/L (ref 22–32)
Calcium: 8.6 mg/dL — ABNORMAL LOW (ref 8.9–10.3)
Chloride: 104 mmol/L (ref 101–111)
Creatinine, Ser: 1.56 mg/dL — ABNORMAL HIGH (ref 0.61–1.24)
GFR calc Af Amer: >60 mL/min (ref >60)
GFR calc non Af Amer: 55 mL/min — ABNORMAL LOW (ref >60)
GLUCOSE: 91 mg/dL (ref 65–99)
POTASSIUM: 4 mmol/L (ref 3.5–5.1)
SODIUM: 136 mmol/L (ref 135–145)
Total Bilirubin: 0.7 mg/dL (ref 0.3–1.2)
Total Protein: 7.2 g/dL (ref 6.5–8.1)

## 2017-12-25 LAB — CBC
HEMATOCRIT: 41.9 % (ref 40.0–52.0)
HEMOGLOBIN: 13.6 g/dL (ref 13.0–18.0)
MCH: 28.9 pg (ref 26.0–34.0)
MCHC: 32.4 g/dL (ref 32.0–36.0)
MCV: 89.3 fL (ref 80.0–100.0)
Platelets: 268 10*3/uL (ref 150–440)
RBC: 4.7 MIL/uL (ref 4.40–5.90)
RDW: 12.6 % (ref 11.5–14.5)
WBC: 6 10*3/uL (ref 3.8–10.6)

## 2017-12-25 NOTE — Progress Notes (Signed)
Hematology/Oncology follow-up note [facility] [phone] [phone]   Patient Care Team: Patient, No Pcp Per as PCP - General (General Practice)  Hospital follow-up for acute DVT and PE.   HISTORY OF PRESENTING ILLNESS:  Sergio Ramirez is a  37 y.o.  male with PMH listed below who was seen by me during his most recent hospitalization due to acute DVT and PE present to follow-up. # 12/02/2017 he presented with left lower extremity pain. He tells me that he's been noticed left lower extremity swelling and the pain for about a week and half. Pain is swelling get worse so he presented to emergency room. He was found to have a left lower extremity DVT and he was given 1 dose of Xarelto and sent home with prescription. Patient also reports some shortness of breath at the time. CT chest wasn't done. Patient was sent home and came back to emergency room the next morning because of worsening of leg pain. He has not filled his Xarelto prescription yet. Is also feeling more shortness of breath, and intermittent chest pain and he takes a deep breath. He had a CT scan PE protocol which showed PE. Patient denies any previous history of DVT or PE, denies any leg injury, recent surgery, long-distance CAR ride or flight.   This was considered to be an unprovoked DVT and PE. Patient was given anticoagulation with heparin drip and switched to Xarelto at discharge.  #Today patient reports doing well, currently he is due on Xarelto 15 mg twice daily and he will be switched to take Xarelto 20 mg daily after he completes the initial 21 days.  He has noticed intermittent bright red blood in the stool.  He has a history of hemorrhoids.  His left lower extremity swelling has significantly improved.  Although he still has swelling at the end of the day.  Shortness of breath has significantly improved and completely resolved.   Review of Systems  Constitutional: Negative for  chills, fever and malaise/fatigue.  HENT: Negative for ear discharge, hearing loss and nosebleeds.   Eyes: Negative for blurred vision.  Respiratory: Negative for cough, hemoptysis and sputum production.   Cardiovascular: Negative for chest pain, orthopnea and claudication.  Gastrointestinal: Positive for blood in stool. Negative for abdominal pain, diarrhea, heartburn, nausea and vomiting.  Genitourinary: Negative for dysuria and urgency.  Musculoskeletal: Negative for myalgias and neck pain.  Skin: Negative for rash.  Neurological: Negative for dizziness and tingling.  Endo/Heme/Allergies: Does not bruise/bleed easily.  Psychiatric/Behavioral: Negative for depression. The patient is not nervous/anxious.     MEDICAL HISTORY:  Past Medical History:  Diagnosis Date  . DVT (deep venous thrombosis) (HCC)   . Pulmonary embolism (HCC)     SURGICAL HISTORY: History reviewed. No pertinent surgical history.  SOCIAL HISTORY: Social History   Socioeconomic History  . Marital status: Single    Spouse name: Not on file  . Number of children: Not on file  . Years of education: Not on file  . Highest education level: Not on file  Social Needs  . Financial resource strain: Not on file  . Food insecurity - worry: Not on file  . Food insecurity - inability: Not on file  . Transportation needs - medical: Not on file  . Transportation needs - non-medical: Not on file  Occupational History  . Not on file  Tobacco Use  . Smoking status: Current Every Day Smoker    Packs/day: 0.50    Years:  2.00    Pack years: 1.00    Types: Cigarettes  . Smokeless tobacco: Never Used  Substance and Sexual Activity  . Alcohol use: Yes    Alcohol/week: 13.2 oz    Types: 12 Cans of beer, 10 Shots of liquor per week    Comment: occasionally  . Drug use: Yes    Types: Marijuana  . Sexual activity: Yes    Birth control/protection: Condom  Other Topics Concern  . Not on file  Social History Narrative    . Not on file    FAMILY HISTORY: History reviewed. No pertinent family history.  ALLERGIES:  has No Known Allergies.  MEDICATIONS:  Current Outpatient Medications  Medication Sig Dispense Refill  . Rivaroxaban 15 & 20 MG TBPK Take as directed on package: Start with one 15mg  tablet by mouth twice a day with food. On Day 22, switch to one 20mg  tablet once a day with food. 51 each 0   No current facility-administered medications for this visit.      PHYSICAL EXAMINATION: ECOG PERFORMANCE STATUS: 0 - Asymptomatic Vitals:   12/25/17 1041  BP: 130/79  Pulse: 83  Temp: (!) 96.3 F (35.7 C)  SpO2: 98%   Filed Weights   12/25/17 1041  Weight: 264 lb 3 oz (119.8 kg)    Physical Exam  Constitutional: He is oriented to person, place, and time. No distress.  HENT:  Mouth/Throat: Oropharynx is clear and moist. No oropharyngeal exudate.  Eyes: Conjunctivae are normal. Pupils are equal, round, and reactive to light. No scleral icterus.  Neck: Normal range of motion. Neck supple. No JVD present.  Cardiovascular: Normal rate, regular rhythm and normal heart sounds.  No murmur heard. Pulmonary/Chest: Effort normal and breath sounds normal.  Abdominal: Soft. Bowel sounds are normal. He exhibits no distension.  Musculoskeletal: Normal range of motion. He exhibits no edema.  Lymphadenopathy:    He has no cervical adenopathy.  Neurological: He is oriented to person, place, and time. No cranial nerve deficit.  Skin: Skin is warm and dry.  Psychiatric: Affect and judgment normal.     LABORATORY DATA:  I have reviewed the data as listed Lab Results  Component Value Date   WBC 6.0 12/25/2017   HGB 13.6 12/25/2017   HCT 41.9 12/25/2017   MCV 89.3 12/25/2017   PLT 268 12/25/2017   Recent Labs    12/03/17 1139 12/05/17 0432 12/25/17 1125  NA 137 139 136  K 4.7 3.8 4.0  CL 102 102 104  CO2 26 27 27   GLUCOSE 102* 104* 91  BUN 13 13 14   CREATININE 1.43* 1.26* 1.56*  CALCIUM  9.2 8.9 8.6*  GFRNONAA >60 >60 55*  GFRAA >60 >60 >60  PROT  --  7.2 7.2  ALBUMIN  --  3.6 4.0  AST  --  16 18  ALT  --  12* 13*  ALKPHOS  --  63 64  BILITOT  --  0.7 0.7  BILIDIR  --  <0.1*  --   IBILI  --  NOT CALCULATED  --        ASSESSMENT & PLAN:  1. Chronic anticoagulation   2. Bilateral pulmonary embolism (HCC)   3. Acute deep vein thrombosis (DVT) of popliteal vein of left lower extremity (HCC)   4. Blood in stool    Discussed with patient that since he had an episode of unprovoked VTE, chances of recurrence is high.  I recommend long-term anticoagulation if he can tolerate. Regarding  to hyper coagulable workup, I will only send factor V Leiden mutation and prothrombin gene mutation test.  But I will hold rest of the hypercoagulable workup as test results can be altered in acute setting, and some test can be falsely positive while patient on Xarelto.   #Blood in stool, today recheck CBC reveals stable hemoglobin.  Will refer patient to gastroenterology Dr. Allegra Lai for further evaluation. Given that patient has recent thrombosis is vented, less than 3 months, he is at very high risk of thrombosis.  Prefer patient to continue on Xarelto if he diagnostic colonoscopy evaluation is needed.  If patient does need a polypectomy which is considered to be a high risk procedure in the future, if non urgently, then he can have the procedure done electively after patient completed 3 months of anticoagulation.   # Likely underlying CKD, CrCl>60, continue to monitor while he is on Xarelto, dose does not need to be adjusted until CrCl<30.   All questions were answered. The patient knows to call the clinic with any problems questions or concerns.  Return of visit: 2 months with repeat labs.  Thank you for this kind referral and the opportunity to participate in the care of this patient. A copy of today's note is routed to referring provider    Rickard Patience, MD, PhD Hematology Oncology [facility] Pager- [phone] 12/25/2017

## 2017-12-25 NOTE — Progress Notes (Signed)
Patient here today for hospital follow up for DVT and pulmonary embolism.  Pt c/o left foot swelling and numbness, along with new onset of blood in stool.

## 2017-12-31 ENCOUNTER — Ambulatory Visit: Payer: Self-pay | Admitting: Gastroenterology

## 2017-12-31 ENCOUNTER — Encounter: Payer: Self-pay | Admitting: *Deleted

## 2017-12-31 LAB — PROTHROMBIN GENE MUTATION

## 2018-01-01 LAB — FACTOR 5 LEIDEN

## 2018-01-03 ENCOUNTER — Other Ambulatory Visit: Payer: Self-pay | Admitting: *Deleted

## 2018-01-03 MED ORDER — RIVAROXABAN 20 MG PO TABS: 20.0000 mg | ORAL_TABLET | Freq: Every day | ORAL | 1 refills | Status: DC

## 2018-01-15 NOTE — Progress Notes (Unsigned)
PSN mailed to patient, today, his part of the Xarelto application to complete.  Once completed, he was instructed to bring the application back to PSN with proof of household income.

## 2018-01-15 NOTE — Progress Notes (Unsigned)
Patient did not show up for his appointment to apply for assistance for the drug Xarelto yesterday at 1pm.

## 2018-02-10 ENCOUNTER — Emergency Department: Payer: Self-pay

## 2018-02-10 ENCOUNTER — Other Ambulatory Visit: Payer: Self-pay

## 2018-02-10 ENCOUNTER — Encounter: Payer: Self-pay | Admitting: Emergency Medicine

## 2018-02-10 ENCOUNTER — Emergency Department
Admission: EM | Admit: 2018-02-10 | Discharge: 2018-02-10 | Disposition: A | Discharge status: Home / Self Care | Payer: Self-pay | Attending: Emergency Medicine | Admitting: Emergency Medicine

## 2018-02-10 DIAGNOSIS — R0602 Shortness of breath: Secondary | ICD-10-CM | POA: Insufficient documentation

## 2018-02-10 DIAGNOSIS — F17210 Nicotine dependence, cigarettes, uncomplicated: Secondary | ICD-10-CM | POA: Insufficient documentation

## 2018-02-10 LAB — CBC WITH DIFFERENTIAL/PLATELET
BASOS ABS: 0.1 10*3/uL (ref 0–0.1)
BASOS PCT: 1 %
Eosinophils Absolute: 0.2 10*3/uL (ref 0–0.7)
Eosinophils Relative: 5 %
HEMATOCRIT: 45.7 % (ref 40.0–52.0)
HEMOGLOBIN: 14.8 g/dL (ref 13.0–18.0)
Lymphocytes Relative: 27 %
Lymphs Abs: 1.4 10*3/uL (ref 1.0–3.6)
MCH: 29 pg (ref 26.0–34.0)
MCHC: 32.4 g/dL (ref 32.0–36.0)
MCV: 89.4 fL (ref 80.0–100.0)
Monocytes Absolute: 0.4 10*3/uL (ref 0.2–1.0)
Monocytes Relative: 7 %
NEUTROS ABS: 3.2 10*3/uL (ref 1.4–6.5)
NEUTROS PCT: 60 %
Platelets: 264 10*3/uL (ref 150–440)
RBC: 5.11 MIL/uL (ref 4.40–5.90)
RDW: 13.3 % (ref 11.5–14.5)
WBC: 5.3 10*3/uL (ref 3.8–10.6)

## 2018-02-10 LAB — COMPREHENSIVE METABOLIC PANEL
ALBUMIN: 4.2 g/dL (ref 3.5–5.0)
ALK PHOS: 65 U/L (ref 38–126)
ALT: 14 U/L — ABNORMAL LOW (ref 17–63)
AST: 21 U/L (ref 15–41)
Anion gap: 9 (ref 5–15)
BILIRUBIN TOTAL: 1 mg/dL (ref 0.3–1.2)
BUN: 9 mg/dL (ref 6–20)
CALCIUM: 9 mg/dL (ref 8.9–10.3)
CO2: 26 mmol/L (ref 22–32)
Chloride: 104 mmol/L (ref 101–111)
Creatinine, Ser: 1.3 mg/dL — ABNORMAL HIGH (ref 0.61–1.24)
GFR calc Af Amer: >60 mL/min (ref >60)
GFR calc non Af Amer: >60 mL/min (ref >60)
GLUCOSE: 88 mg/dL (ref 65–99)
POTASSIUM: 4.1 mmol/L (ref 3.5–5.1)
SODIUM: 139 mmol/L (ref 135–145)
TOTAL PROTEIN: 7.5 g/dL (ref 6.5–8.1)

## 2018-02-10 LAB — TROPONIN I: Troponin I: <0.03 ng/mL (ref <0.03)

## 2018-02-10 MED ORDER — IOHEXOL 350 MG/ML SOLN
75.0000 mL | Freq: Once | INTRAVENOUS | Status: AC | PRN
  Administered 2018-02-10: 75 mL via INTRAVENOUS

## 2018-02-10 MED ORDER — ALBUTEROL SULFATE HFA 108 (90 BASE) MCG/ACT IN AERS: 2.0000 | INHALATION_SPRAY | Freq: Four times a day (QID) | RESPIRATORY_TRACT | 0 refills | Status: DC | PRN

## 2018-02-10 NOTE — ED Notes (Signed)
Patient transported to X-ray 

## 2018-02-10 NOTE — ED Triage Notes (Signed)
[facility]SE [facility]. Pt reports feels like when had blood clot in lungs before; dx 2 months ago. Has not been taking blood thinners. ambulatory and unlabored at this time.

## 2018-02-10 NOTE — ED Provider Notes (Signed)
[facility] Emergency Department Provider Note ____________________________________________   First MD Initiated Contact with Patient 02/10/18 1311     (approximate)  I have reviewed the triage vital signs and the nursing notes.   HISTORY  Chief Complaint Shortness of Breath    HPI Sergio Ramirez is a 37 y.o. male with recent diagnosis of DVT and PE, on Xarelto (but not compliant for the last 1-2 weeks) who presents with shortness of breath, relatively acute onset about 4 days ago, intermittent, worse with exertion, and associated with some chest pain.  Patient states feels similar to when he was diagnosed with the PE.  He denies any fever, cough, or any new or recurrent leg pain or swelling.   Past Medical History:  Diagnosis Date  . DVT (deep venous thrombosis) (HCC)   . Pulmonary embolism [facility])     Patient Active Problem List   Diagnosis Date Noted  . 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 Bilateral pulmonary embolism (HCC) 12/03/2017    History reviewed. No pertinent surgical history.  Prior to Admission medications   Medication Sig Start Date End Date Taking? Authorizing Provider  albuterol (PROVENTIL HFA;VENTOLIN HFA) 108 (90 Base) MCG/ACT inhaler Inhale 2 puffs into the lungs every 6 (six) hours as needed for wheezing or shortness of breath. 02/10/18   Dionne Bucy, MD  rivaroxaban (XARELTO) 20 MG TABS tablet Take 1 tablet (20 mg total) by mouth daily with supper. Patient not taking: Reported on 02/10/2018 01/03/18   Rickard Patience, MD    Allergies Patient has no known allergies.  No family history on file.  Social History Social History   Tobacco Use  . Smoking status: Current Every Day Smoker    Packs/day: 0.50    Years: 2.00    Pack years: 1.00    Types: Cigarettes  . Smokeless tobacco: Never Used  Substance Use Topics  . Alcohol use: Yes    Alcohol/week: 13.2 oz    Types: 12 Cans of beer, 10 Shots of  liquor per week    Comment: occasionally  . Drug use: Yes    Types: Marijuana    Review of Systems  Constitutional: No fever. Eyes: No redness. ENT: No sore throat. Cardiovascular: Positive for chest pain. Respiratory: Positive for shortness of breath. Gastrointestinal: No vomiting.  Genitourinary: Negative for dysuria.  Musculoskeletal: Negative for back pain. Skin: Negative for rash. Neurological: Negative for headache.   ____________________________________________   PHYSICAL EXAM:  VITAL SIGNS: ED Triage Vitals  Enc Vitals Group     BP 02/10/18 1250 137/77     Pulse Rate 02/10/18 1250 79     Resp 02/10/18 1250 18     Temp 02/10/18 1250 98.6 F (37 C)     Temp Source 02/10/18 1250 Oral     SpO2 02/10/18 1250 99 %     Weight 02/10/18 1249 250 lb (113.4 kg)     Height 02/10/18 1249 6\' 4"  (1.93 m)     Head Circumference --      Peak Flow --      Pain Score --      Pain Loc --      Pain Edu? --      Excl. in GC? --     Constitutional: Alert and oriented. Well appearing and in no acute distress. Eyes: Conjunctivae are normal.  Head: Atraumatic. Nose: No congestion/rhinnorhea. Mouth/Throat: Mucous membranes are moist.   Neck: Normal range of motion.  Cardiovascular: Normal rate, regular rhythm. Grossly normal heart sounds.  Good peripheral circulation. Respiratory: Normal respiratory effort.  No retractions. Lungs CTAB. Gastrointestinal: No distention.  Musculoskeletal: No lower extremity edema.  Extremities warm and well perfused.  No calf or popliteal swelling or tenderness. Neurologic:  Normal speech and language. No gross focal neurologic deficits are appreciated.  Skin:  Skin is warm and dry. No rash noted. Psychiatric: Mood and affect are normal. Speech and behavior are normal.  ____________________________________________   LABS (all labs ordered are listed, but only abnormal results are displayed)  Labs Reviewed  COMPREHENSIVE METABOLIC PANEL -  Abnormal; Notable for the following components:      Result Value   Creatinine, Ser 1.30 (*)    ALT 14 (*)    All other components within normal limits  CBC WITH DIFFERENTIAL/PLATELET  TROPONIN I   ____________________________________________  EKG  ED ECG REPORT I, Dionne BucySebastian Juelz Whittenberg, the attending physician, personally viewed and interpreted this ECG.  Date: 02/10/2018 EKG Time: 1248 Rate: 80 Rhythm: normal sinus rhythm QRS Axis: normal Intervals: normal ST/T Wave abnormalities: normal Narrative Interpretation: no evidence of acute ischemia  ____________________________________________  RADIOLOGY  CXR: No focal infiltrate or other acute finding CT chest: No acute DVT  ____________________________________________   PROCEDURES  Procedure(s) performed: No  Procedures  Critical Care performed: No ____________________________________________   INITIAL IMPRESSION / ASSESSMENT AND PLAN / ED COURSE  Pertinent labs & imaging results that were available during my care of the patient were reviewed by me and considered in my medical decision making (see chart for details).  37 year old male with past medical history as noted above including relatively recent diagnosis of DVT and PE (was placed on Xarelto but states he has been noncompliant for the last 1-2 weeks) presents with recurrent chest pain and shortness of breath for the last several days, which she states feels similar to prior PE.  I reviewed the past medical records in epic; the patient was seen in the ED in January for leg swelling and diagnosed with DVT.  He was started on Xarelto but then re-presented with chest pain and was diagnosed with PE.  Patient has been on Xarelto, however he says that it made him feel unwell and he had some blood in the stool so he self discontinued last week.  On exam, the patient is well-appearing, vital signs are normal, and the remainder the exam is unremarkable.  EKG is  normal.  Differential includes recurrent PE, however given the patient has no hypoxia or tachycardia, other considerations are bronchitis, pneumonia, or less likely cardiac etiology.  We will obtain chest x-ray, basic and cardiac labs, and plan for likely CT if these are nondiagnostic.    ----------------------------------------- 3:26 PM on 02/10/2018 -----------------------------------------  Lab workup is unremarkable and chest x-ray was clear.  CT chest was obtained, which shows no acute DVT.  Overall I suspect that the patient is having a mild episode of bronchitis given that he does have some URI symptoms.  I will prescribe an inhaler to use as needed.  I had an extensive discussion with the patient about his ongoing workup and treatment, and his noncompliance with the Xarelto.  Patient states that he did have some blood in the stool after taking the Xarelto, and also felt generally unwell while he was on it, and the symptoms resolved when he stopped taking it.  I reviewed the notes in Epic; the patient saw Dr. Cathie HoopsYu from hematology who advised to continue the Xarelto  despite the bleeding, and helped arrange for follow-up with gastroenterology.  I discussed this with the patient, and advised that I would strongly recommend to restart and continue the Xarelto until he follows up with hematology, but that it would be potentially productive to discuss with the hematologist about switching to a different agent.  I offered to call hematology here to discuss potentially switching to a different agent, but the patient declined, stating that he agreed with me and would start the Xarelto and then follow-up with his hematologist on his own.  I gave thorough return precautions, and the patient expressed understanding.  ____________________________________________   FINAL CLINICAL IMPRESSION(S) / ED DIAGNOSES  Final diagnoses:  Shortness of breath      NEW MEDICATIONS STARTED DURING THIS  VISIT:  New Prescriptions   ALBUTEROL (PROVENTIL HFA;VENTOLIN HFA) 108 (90 BASE) MCG/ACT INHALER    Inhale 2 puffs into the lungs every 6 (six) hours as needed for wheezing or shortness of breath.     Note:  This document was prepared using Dragon voice recognition software and may include unintentional dictation errors.     Dionne Bucy, MD 02/10/18 [phone]

## 2018-02-10 NOTE — ED Triage Notes (Signed)
Pt to the ED via POV c/o shortness of breath. Pt has hx/o DVT and PE, pt has not been taking his blood thinners. Pt states that the shortness of breath started 4 days ago. Pt states that he has also had chest pain for the past 4 day. Pt denies pain or swelling in his legs. Pt in NAD at this time.

## 2018-02-10 NOTE — Discharge Instructions (Addendum)
May use the inhaler as needed for shortness of breath or chest tightness, and over-the-counter Tylenol for pain.  We strongly advise that you restart the Xarelto and take it until you follow-up with your doctor.  Call your hematologist office tomorrow to arrange for follow-up to discuss risks and benefits of continuing the Xarelto and possible alternatives.  You should also follow-up with the gastroenterologist as planned.  Return to the emergency department for new, worsening, or persistent shortness of breath, chest pain, weakness or lightheadedness, new or worsening leg swelling, or any other new or worsening symptoms that concern you.

## 2018-02-26 ENCOUNTER — Inpatient Hospital Stay: Payer: Self-pay | Attending: Oncology

## 2018-02-26 ENCOUNTER — Inpatient Hospital Stay: Payer: Self-pay | Admitting: Oncology

## 2018-04-05 ENCOUNTER — Other Ambulatory Visit: Payer: Self-pay

## 2018-04-05 ENCOUNTER — Emergency Department: Payer: Self-pay

## 2018-04-05 ENCOUNTER — Encounter: Payer: Self-pay | Admitting: Emergency Medicine

## 2018-04-05 ENCOUNTER — Emergency Department
Admission: EM | Admit: 2018-04-05 | Discharge: 2018-04-05 | Disposition: A | Discharge status: Home / Self Care | Payer: Self-pay | Attending: Emergency Medicine | Admitting: Emergency Medicine

## 2018-04-05 DIAGNOSIS — R079 Chest pain, unspecified: Secondary | ICD-10-CM | POA: Insufficient documentation

## 2018-04-05 DIAGNOSIS — F1210 Cannabis abuse, uncomplicated: Secondary | ICD-10-CM | POA: Insufficient documentation

## 2018-04-05 DIAGNOSIS — R0781 Pleurodynia: Secondary | ICD-10-CM | POA: Insufficient documentation

## 2018-04-05 DIAGNOSIS — Z86711 Personal history of pulmonary embolism: Secondary | ICD-10-CM | POA: Insufficient documentation

## 2018-04-05 DIAGNOSIS — F17210 Nicotine dependence, cigarettes, uncomplicated: Secondary | ICD-10-CM | POA: Insufficient documentation

## 2018-04-05 DIAGNOSIS — R05 Cough: Secondary | ICD-10-CM | POA: Insufficient documentation

## 2018-04-05 LAB — CBC WITH DIFFERENTIAL/PLATELET
BASOS ABS: 0.1 10*3/uL (ref 0–0.1)
Basophils Relative: 1 %
EOS ABS: 0.1 10*3/uL (ref 0–0.7)
EOS PCT: 1 %
HCT: 44.8 % (ref 40.0–52.0)
Hemoglobin: 14.8 g/dL (ref 13.0–18.0)
Lymphocytes Relative: 22 %
Lymphs Abs: 1.8 10*3/uL (ref 1.0–3.6)
MCH: 30.1 pg (ref 26.0–34.0)
MCHC: 33.1 g/dL (ref 32.0–36.0)
MCV: 91 fL (ref 80.0–100.0)
MONO ABS: 0.6 10*3/uL (ref 0.2–1.0)
Monocytes Relative: 7 %
Neutro Abs: 5.8 10*3/uL (ref 1.4–6.5)
Neutrophils Relative %: 69 %
PLATELETS: 282 10*3/uL (ref 150–440)
RBC: 4.92 MIL/uL (ref 4.40–5.90)
RDW: 13.4 % (ref 11.5–14.5)
WBC: 8.3 10*3/uL (ref 3.8–10.6)

## 2018-04-05 LAB — COMPREHENSIVE METABOLIC PANEL
ALT: 23 U/L (ref 17–63)
AST: 21 U/L (ref 15–41)
Albumin: 3.9 g/dL (ref 3.5–5.0)
Alkaline Phosphatase: 55 U/L (ref 38–126)
Anion gap: 5 (ref 5–15)
BUN: 10 mg/dL (ref 6–20)
CHLORIDE: 103 mmol/L (ref 101–111)
CO2: 31 mmol/L (ref 22–32)
Calcium: 8.6 mg/dL — ABNORMAL LOW (ref 8.9–10.3)
Creatinine, Ser: 1.2 mg/dL (ref 0.61–1.24)
GFR calc Af Amer: >60 mL/min (ref >60)
Glucose, Bld: 85 mg/dL (ref 65–99)
Potassium: 3.9 mmol/L (ref 3.5–5.1)
SODIUM: 139 mmol/L (ref 135–145)
Total Bilirubin: 1.1 mg/dL (ref 0.3–1.2)
Total Protein: 7.3 g/dL (ref 6.5–8.1)

## 2018-04-05 MED ORDER — IOPAMIDOL (ISOVUE-370) INJECTION 76%
80.0000 mL | Freq: Once | INTRAVENOUS | Status: AC | PRN
  Administered 2018-04-05: 80 mL via INTRAVENOUS
  Filled 2018-04-05: qty 100

## 2018-04-05 NOTE — ED Provider Notes (Signed)
-----------------------------------------   4:18 PM on 04/05/2018 -----------------------------------------  EKG reviewed and interpreted by myself shows normal sinus rhythm 84 bpm with a narrow QRS, normal axis, normal intervals, no concerning ST changes.  Normal EKG.   Minna Antis, MD 04/05/18 [phone]

## 2018-04-05 NOTE — ED Provider Notes (Signed)
[facility] Emergency Department Provider Note  ____________________________________________   First MD Initiated Contact with Patient 04/05/18 1511     (approximate)  I have reviewed the triage vital signs and the nursing notes.   HISTORY  Chief Complaint Cough and Wheezing   HPI Sergio Ramirez is a 37 y.o. male who presents to the emergency department for treatment and evaluation of wheezing an intermittent chest pain for the past 5-6 days. He has a recent history of DVT and PE for which he is taking Xarelto. He reports that he has been compliant with his medication and has not missed any doses. He states that about 10 days ago, his left leg was swollen after work but he wasn't wearing his compression stocking that day and didn't think much about it. Swelling lasted a couple of days and went away. He didn't have calf pain during that time, "it just felt tight." He denies shortness of breath, but states that he has been having episodes of uncontrollable and persistent coughing "like last time" he had a PE. He denies recent URI symptoms, COPD or asthma. Chest pain is intermittent and sharp on the left lateral thorax. Other than Xarelto, he has not taken anything for his pain. He denies recent travel or prolonged immobilization, he is not undergoing hormone replacement therapy, but does continue to smoke cigarettes.   Past Medical History:  Diagnosis Date  . DVT (deep venous thrombosis) (HCC)   . Pulmonary embolism [facility])     Patient Active Problem List   Diagnosis Date Noted  . 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 Bilateral pulmonary embolism (HCC) 12/03/2017    History reviewed. No pertinent surgical history.  Prior to Admission medications   Medication Sig Start Date End Date Taking? Authorizing Provider  albuterol (PROVENTIL HFA;VENTOLIN HFA) 108 (90 Base) MCG/ACT inhaler Inhale 2 puffs into the lungs every 6 (six) hours as  needed for wheezing or shortness of breath. 02/10/18   Dionne Bucy, MD  rivaroxaban (XARELTO) 20 MG TABS tablet Take 1 tablet (20 mg total) by mouth daily with supper. Patient not taking: Reported on 02/10/2018 01/03/18   Rickard Patience, MD    Allergies Patient has no known allergies.  No family history on file.  Social History Social History   Tobacco Use  . Smoking status: Current Every Day Smoker    Packs/day: 0.50    Years: 2.00    Pack years: 1.00    Types: Cigarettes  . Smokeless tobacco: Never Used  Substance Use Topics  . Alcohol use: Yes    Alcohol/week: 13.2 oz    Types: 12 Cans of beer, 10 Shots of liquor per week    Comment: occasionally  . Drug use: Yes    Types: Marijuana    Review of Systems  Constitutional: No fever/chills Eyes: No visual changes. ENT: No sore throat. Cardiovascular: Positive for chest pain. Respiratory: Denies shortness of breath. Positive for cough and wheezing. Gastrointestinal: No abdominal pain.  No nausea, no vomiting.  No diarrhea.  No constipation. Genitourinary: Negative for dysuria. Musculoskeletal: Negative for back pain. Skin: Negative for rash. Neurological: Negative for headaches, focal weakness or numbness. ____________________________________________   PHYSICAL EXAM:  VITAL SIGNS: ED Triage Vitals [04/05/18 1503]  Enc Vitals Group     BP 131/84     Pulse Rate 91     Resp 16     Temp 98.6 F (37 C)     Temp Source  Oral     SpO2 97 %     Weight 250 lb (113.4 kg)     Height  (1.93 m)     Head Circumference      Peak Flow      Pain Score 0     Pain Loc      Pain Edu?      Excl. in GC?     Constitutional: Alert and oriented. Well appearing and in no acute distress. Eyes: Conjunctivae are normal.  Head: Atraumatic. Nose: No congestion/rhinnorhea. Mouth/Throat: Mucous membranes are moist.  Oropharynx non-erythematous. Neck: No stridor.   Cardiovascular: Normal rate, regular rhythm. Grossly normal  heart sounds.  Good peripheral circulation. Respiratory: Normal respiratory effort.  No retractions. Lungs CTAB. Gastrointestinal: Soft and nontender. No distention. No abdominal bruits. No CVA tenderness. Musculoskeletal: Homan's sign negative. No lower extremity tenderness nor edema.  No joint effusions. Neurologic:  Normal speech and language. No gross focal neurologic deficits are appreciated. No gait instability. Skin:  Skin is warm, dry and intact. No rash noted. Psychiatric: Mood and affect are normal. Speech and behavior are normal.  ____________________________________________   LABS (all labs ordered are listed, but only abnormal results are displayed)  Labs Reviewed  COMPREHENSIVE METABOLIC PANEL - Abnormal; Notable for the following components:      Result Value   Calcium 8.6 (*)    All other components within normal limits  CBC WITH DIFFERENTIAL/PLATELET   ____________________________________________  EKG  Sinus rhythm, rate 84 bpm, no axis deviation, no acute changes, regular and normal intervals. Reviewed by me. ____________________________________________  RADIOLOGY  Official radiology report(s): Ct Angio Chest Pe W And/or Wo Contrast  Result Date: 04/05/2018 CLINICAL DATA:  Shortness of cough wheezing. EXAM: CT ANGIOGRAPHY CHEST WITH CONTRAST TECHNIQUE: Multidetector CT imaging of the chest was performed using the standard protocol during bolus administration of intravenous contrast. Multiplanar CT image reconstructions and MIPs were obtained to evaluate the vascular anatomy. CONTRAST:  80mL ISOVUE-370 IOPAMIDOL (ISOVUE-370) INJECTION 76% COMPARISON:  CT angiogram chest and chest radiograph February 10, 2018 FINDINGS: Cardiovascular: No acute pulmonary embolus evident. The web like appearance in lower lobe pulmonary artery branches noted on prior study is not appreciable on current examination. No changes suggesting chronic pulmonary embolus are demonstrable on this  current examination. There is no thoracic aortic aneurysm or dissection. The visualized great vessels appear normal. The right innominate and left common carotid arteries arise as a common trunk, an anatomic variant. There is no pericardial effusion or pericardial thickening evident. Mediastinum/Nodes: Thyroid appears unremarkable. There is no appreciable thoracic adenopathy. No thyroid lesions are demonstrable. Lungs/Pleura: There is no demonstrable edema or consolidation. There is no appreciable pleural effusion or pleural thickening. On axial slice 49 series 5, there is a stable 4 mm nodular opacity in the anterior segment of the right upper lobe. On axial slice 64 series 5, there is a stable 3 mm nodular opacity in the superior segment right lower lobe. There is a 5 mm nodular opacity in the lateral segment right lower lobe on axial slice 64 series 5, felt to be present but better seen currently. There is a 5 mm nodular opacity in the lateral segment left lower lobe peripherally, stable from previous study. No new pulmonary nodular lesions are evident. Upper Abdomen: Visualized upper abdominal structures appear unremarkable. Musculoskeletal: There are no blastic or lytic bone lesions. No chest wall lesions are evident. Review of the MIP images confirms the above findings. IMPRESSION: 1. No evident  acute pulmonary embolus. Changes suggesting lower lobe chronic pulmonary embolus seen on prior study are no longer evident. No thoracic aortic aneurysm or dissection. 2. Stable scattered pulmonary nodular lesions, largest measuring 5 mm. No follow-up needed if patient is low-risk (and has no known or suspected primary neoplasm). Non-contrast chest CT can be considered in 12 months if patient is high-risk. This recommendation follows the consensus statement: Guidelines for Management of Incidental Pulmonary Nodules Detected on CT Images: From the Fleischner Society 2017; Radiology 2017; 284:228-243. No lung edema or  consolidation. 3.  No evident thoracic adenopathy. Electronically Signed   By: Bretta Bang III M.D.   On: 04/05/2018 16:55   US Venous Img Lower Unilateral Left  Result Date: 04/05/2018 CLINICAL DATA:  Cough, wheezing, history of pulmonary embolism. EXAM: LEFT LOWER EXTREMITY VENOUS DOPPLER ULTRASOUND TECHNIQUE: Gray-scale sonography with compression, as well as color and duplex ultrasound, were performed to evaluate the deep venous system from the level of the common femoral vein through the popliteal and proximal calf veins. COMPARISON:  12/03/2017 FINDINGS: Normal compressibility of the common femoral, superficial femoral, and proximal popliteal veins. There is partially occlusive thrombus in the distal popliteal vein resulting in incomplete compressibility. There is residual noncompressible thrombus in a peroneal vein segment. Posterior tibial veins normal. IMPRESSION: 1. Residual partially occlusive peroneal and distal popliteal DVT. No progression since previous exam. Electronically Signed   By: Corlis Leak M.D.   On: 04/05/2018 16:17    ____________________________________________   PROCEDURES  Procedure(s) performed: None  Procedures  Critical Care performed: No  ____________________________________________   INITIAL IMPRESSION / ASSESSMENT AND PLAN / ED COURSE  As part of my medical decision making, I reviewed the following data within the electronic MEDICAL RECORD NUMBER Nursing notes reviewed and incorporated, Old chart reviewed, Notes from prior ED visits and [place] Controlled Substance Database   37 year old male presenting to the emergency department for evaluation and treatment of wheezing, cough, sharp chest wall pain in the presence of recent PE and DVT. Although he reports compliance with his Xarelto therapy since late March, pre-test probability is moderate. Left LE doppler US ordered as well as CT chest angio.   ----------------------------------------- 4:51 PM on  04/05/2018 -----------------------------------------  EKG unremarkable for concern of STEMI. Some indication of changes in Q3, and T3 without changes in S1. Awaiting readings of [place] and chest CT.  Differential diagnosis includes but is not limited to: DVT, PE, CHF, pleuritic chest wall pain, reactive airway disease.  ----------------------------------------- 5:11 PM on 04/05/2018 ----------------------------------------- No indication of new or worsening PE per CT.  No new DVT in the left lower extremity per ultrasound.  Patient was strongly encouraged to schedule follow-up appointment with the primary care provider as soon as possible.  He was given [facility] and encouraged to call to establish care.  He was also advised to return to the emergency department immediately for symptoms that change or worsen.  He was strongly encouraged also to continue taking the Xarelto as prescribed.  He will be discharged home.  No new prescriptions given today.       ____________________________________________   FINAL CLINICAL IMPRESSION(S) / ED DIAGNOSES  Final diagnoses:  Chest pain, pleuritic     ED Discharge Orders    None       Note:  This document was prepared using Dragon voice recognition software and may include unintentional dictation errors.    Chinita Pester, FNP 04/05/18 1958  Jeanmarie Plant, MD 04/06/18 [phone]

## 2018-04-05 NOTE — ED Triage Notes (Signed)
Pt to ED via POV stating that he has had cough and wheezing for the past 5-6 days. Pt denies fever or chills. Pt is in NAD at this time.

## 2018-04-05 NOTE — Discharge Instructions (Signed)
Please continue to take your Xarelto. It is very important that you get a primary care provider as soon as possible.  Return to the ER if you begin to feel short of breath, you have pain and swelling in your leg/legs or other symptoms of concern.

## 2018-11-21 ENCOUNTER — Emergency Department: Payer: Self-pay

## 2018-11-21 ENCOUNTER — Emergency Department
Admission: EM | Admit: 2018-11-21 | Discharge: 2018-11-21 | Disposition: A | Discharge status: Home / Self Care | Payer: Self-pay | Attending: Emergency Medicine | Admitting: Emergency Medicine

## 2018-11-21 ENCOUNTER — Encounter: Payer: Self-pay | Admitting: Emergency Medicine

## 2018-11-21 ENCOUNTER — Other Ambulatory Visit: Payer: Self-pay

## 2018-11-21 DIAGNOSIS — F17210 Nicotine dependence, cigarettes, uncomplicated: Secondary | ICD-10-CM | POA: Insufficient documentation

## 2018-11-21 DIAGNOSIS — Z86711 Personal history of pulmonary embolism: Secondary | ICD-10-CM | POA: Insufficient documentation

## 2018-11-21 DIAGNOSIS — Z86718 Personal history of other venous thrombosis and embolism: Secondary | ICD-10-CM | POA: Insufficient documentation

## 2018-11-21 DIAGNOSIS — J069 Acute upper respiratory infection, unspecified: Secondary | ICD-10-CM | POA: Insufficient documentation

## 2018-11-21 DIAGNOSIS — R0789 Other chest pain: Secondary | ICD-10-CM | POA: Insufficient documentation

## 2018-11-21 LAB — INFLUENZA PANEL BY PCR (TYPE A & B)
Influenza A By PCR: NEGATIVE
Influenza B By PCR: NEGATIVE

## 2018-11-21 LAB — BASIC METABOLIC PANEL
Anion gap: 11 (ref 5–15)
BUN: 13 mg/dL (ref 6–20)
CALCIUM: 9.3 mg/dL (ref 8.9–10.3)
CO2: 26 mmol/L (ref 22–32)
CREATININE: 1.25 mg/dL — AB (ref 0.61–1.24)
Chloride: 102 mmol/L (ref 98–111)
GFR calc Af Amer: >60 mL/min (ref >60)
GFR calc non Af Amer: >60 mL/min (ref >60)
GLUCOSE: 135 mg/dL — AB (ref 70–99)
Potassium: 3.7 mmol/L (ref 3.5–5.1)
SODIUM: 139 mmol/L (ref 135–145)

## 2018-11-21 LAB — CBC
HCT: 47.2 % (ref 39.0–52.0)
HEMOGLOBIN: 15.1 g/dL (ref 13.0–17.0)
MCH: 29.5 pg (ref 26.0–34.0)
MCHC: 32 g/dL (ref 30.0–36.0)
MCV: 92.2 fL (ref 80.0–100.0)
Platelets: 271 10*3/uL (ref 150–400)
RBC: 5.12 MIL/uL (ref 4.22–5.81)
RDW: 12.2 % (ref 11.5–15.5)
WBC: 9.9 10*3/uL (ref 4.0–10.5)
nRBC: 0 % (ref 0.0–0.2)

## 2018-11-21 LAB — TROPONIN I
Troponin I: <0.03 ng/mL (ref <0.03)
Troponin I: <0.03 ng/mL (ref <0.03)

## 2018-11-21 MED ORDER — IOHEXOL 350 MG/ML SOLN
75.0000 mL | Freq: Once | INTRAVENOUS | Status: AC | PRN
  Administered 2018-11-21: 75 mL via INTRAVENOUS

## 2018-11-21 MED ORDER — DIPHENHYDRAMINE HCL 25 MG PO CAPS
25.0000 mg | ORAL_CAPSULE | Freq: Once | ORAL | Status: AC
  Administered 2018-11-21: 25 mg via ORAL
  Filled 2018-11-21: qty 1

## 2018-11-21 MED ORDER — ONDANSETRON 4 MG PO TBDP
4.0000 mg | ORAL_TABLET | Freq: Once | ORAL | Status: AC
  Administered 2018-11-21: 4 mg via ORAL
  Filled 2018-11-21: qty 1

## 2018-11-21 NOTE — ED Triage Notes (Signed)
Patient presents to the ED with a migraine x 1 week.  Patient states he has history of a blood clot in his leg but stopped taking xarelto about 3 months ago on his own.  Patient states since he stopped taking it he has felt, "antsy and jittery on the inside".  Patient reports some occasional chest pain with wheezing/shortness of breath.  Patient states that these symptoms have also been intermittent for the past several months.

## 2018-11-21 NOTE — ED Notes (Signed)
Pt denies pain or nausea. Pt able to tolerate po fluids.

## 2018-11-21 NOTE — ED Notes (Signed)
Pt to the er for headache x 5 days. Pt has been taking tylenol at home. Hx of DVT 6 months ago and started the xarelto. Pt reports chest pain r/t wheezing and coughing. Cough for 3 to 4 days. Pt not taking anything at home for cough. Pt received CT but now has a bump to the right side of the face.

## 2018-11-21 NOTE — ED Provider Notes (Signed)
[facility] Emergency Department Provider Note  ____________________________________________  Time seen: Approximately 8:08 PM  I have reviewed the triage vital signs and the nursing notes.   HISTORY  Chief Complaint Migraine and Chest Pain    HPI Sergio Ramirez is a 38 y.o. male with a history of DVT and PE 6 months ago who self discontinued his Xarelto, presenting with cough and cold symptoms, and chest tightness.  The patient reports that for the past 3 days, he has had general malaise with cough and congestion and rhinorrhea without sore throat or ear pain.  No known fevers.  He has not been having any shortness of breath but did develop some central chest tightness.  He became worried that he might have a PE so came for further evaluation.  While he was here, he was undergoing CT angiogram when he had a warm feeling and developed a single episode of vomiting, now resolved.  Past Medical History:  Diagnosis Date  . DVT (deep venous thrombosis) (HCC)   . Pulmonary embolism [facility])     Patient Active Problem List   Diagnosis Date Noted  . 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 Bilateral pulmonary embolism (HCC) 12/03/2017    History reviewed. No pertinent surgical history.  Current Outpatient Rx  . Order #: [id number] Class: Print  . Order #: [id number] Class: Print    Allergies Patient has no known allergies.  No family history on file.  Social History Social History   Tobacco Use  . Smoking status: Current Every Day Smoker    Packs/day: 0.20    Years: 2.00    Pack years: 0.40    Types: Cigarettes  . Smokeless tobacco: Never Used  Substance Use Topics  . Alcohol use: Yes    Alcohol/week: 22.0 standard drinks    Types: 12 Cans of beer, 10 Shots of liquor per week    Comment: occasionally  . Drug use: Yes    Types: Marijuana    Review of Systems Constitutional: No fever/chills.  No lightheadedness or syncope.  No  diaphoresis. Eyes: No visual changes. ENT: No sore throat.  Positive congestion and rhinorrhea. Cardiovascular: Acid of chest tightness. Denies palpitations. Respiratory: Denies shortness of breath.  Positive cough. Gastrointestinal: No abdominal pain.  Single episode of nausea and vomiting, now resolved..  No diarrhea.  No constipation. Genitourinary: Negative for dysuria. Musculoskeletal: Negative for back pain.  No lower extremity swelling or calf pain. Skin: Negative for rash. Neurological: Negative for headaches. No focal numbness, tingling or weakness.     ____________________________________________   PHYSICAL EXAM:  VITAL SIGNS: ED Triage Vitals  Enc Vitals Group     BP 11/21/18 1736 (!) 137/99     Pulse Rate 11/21/18 1736 92     Resp 11/21/18 1736 20     Temp 11/21/18 1736 98.5 F (36.9 C)     Temp Source 11/21/18 1736 Oral     SpO2 11/21/18 1736 97 %     Weight 11/21/18 1737 260 lb (117.9 kg)     Height 11/21/18 1737 6\' 3"  (1.905 m)     Head Circumference --      Peak Flow --      Pain Score 11/21/18 1736 7     Pain Loc --      Pain Edu? --      Excl. in GC? --     Constitutional: Alert and oriented. Well appearing and in no acute distress. Answers questions  appropriately. Eyes: Conjunctivae are normal.  EOMI. No scleral icterus. Head: Atraumatic. Nose: No congestion/rhinnorhea. Mouth/Throat: Mucous membranes are moist.  Neck: No stridor.  Supple.  No JVD.  No meningismus. Cardiovascular: Normal rate, regular rhythm. No murmurs, rubs or gallops.  Respiratory: Normal respiratory effort.  No accessory muscle use or retractions. Lungs CTAB.  No wheezes, rales or ronchi. Gastrointestinal: Soft, nontender and nondistended.  No guarding or rebound.  No peritoneal signs. Musculoskeletal: No LE edema. No ttp in the calves or palpable cords.  Negative Homan's sign. Neurologic:  A&Ox3.  Speech is clear.  Face and smile are symmetric.  EOMI.  Moves all extremities  well. Skin:  Skin is warm, dry.  The patient has 2 areas of raised non-erythematous bumps, one over the zygomatic arch and 1 near the lower eyelid on the right side without obvious evidence of fluctuance, surrounding erythema or warmth. Psychiatric: Mood and affect are normal.   ____________________________________________   LABS (all labs ordered are listed, but only abnormal results are displayed)  Labs Reviewed  BASIC METABOLIC PANEL - Abnormal; Notable for the following components:      Result Value   Glucose, Bld 135 (*)    Creatinine, Ser 1.25 (*)    All other components within normal limits  CBC  TROPONIN I  INFLUENZA PANEL BY PCR (TYPE A & B)  TROPONIN I   ____________________________________________  EKG  ED ECG REPORT I, Anne-Caroline Sharma CovertNorman, the attending physician, personally viewed and interpreted this ECG.   Date: 11/21/2018  EKG Time: 1742  Rate: 79  Rhythm: normal sinus rhythm  Axis: normal  Intervals:none  ST&T Change: No STEMI  ____________________________________________  RADIOLOGY  Dg Chest 2 View  Result Date: 11/21/2018 CLINICAL DATA:  Headache, chest pain EXAM: CHEST - 2 VIEW COMPARISON:  02/10/2018 FINDINGS: The heart size and mediastinal contours are within normal limits. Both lungs are clear. The visualized skeletal structures are unremarkable. IMPRESSION: No active cardiopulmonary disease. Electronically Signed   By: Judie PetitM.  Shick M.D.   On: 11/21/2018 18:02   Ct Angio Chest Pe W And/or Wo Contrast  Result Date: 11/21/2018 CLINICAL DATA:  Chest pain EXAM: CT ANGIOGRAPHY CHEST WITH CONTRAST TECHNIQUE: Multidetector CT imaging of the chest was performed using the standard protocol during bolus administration of intravenous contrast. Multiplanar CT image reconstructions and MIPs were obtained to evaluate the vascular anatomy. CONTRAST:  75mL OMNIPAQUE IOHEXOL 350 MG/ML SOLN COMPARISON:  Same day CXR and chest CT 04/05/2018 FINDINGS: Cardiovascular: The  study is of quality for the evaluation of pulmonary embolism. There are no filling defects in the central, lobar, segmental or subsegmental pulmonary artery branches to suggest acute pulmonary embolism. Great vessels are normal in course and caliber. Normal heart size. No significant pericardial fluid/thickening. Mediastinum/Nodes: No discrete thyroid nodules. Unremarkable esophagus. No pathologically enlarged axillary, mediastinal or hilar lymph nodes. Lungs/Pleura: No pneumothorax. No pleural effusion. Tiny 3 mm nodular density along the major fissure on the right likely to represent a small fissural lymph node as well as along the minor fissure, series 6/39 and 50. Subpleural right upper lobe 3 mm nodular density is also identified series 6/29 unchanged in appearance. No dominant mass is seen. Bibasilar dependent atelectasis is noted. Upper abdomen: Unremarkable. Musculoskeletal:  No aggressive appearing focal osseous lesions. Review of the MIP images confirms the above findings. IMPRESSION: 1. No acute pulmonary embolus, aortic aneurysm or dissection. 2. Stable 3 mm or less nodular densities in the right lung as above described. No follow-up needed  if patient is low-risk (and has no known or suspected primary neoplasm). Non-contrast chest CT can be considered in 12 months if patient is high-risk. This recommendation follows the consensus statement: Guidelines for Management of Incidental Pulmonary Nodules Detected on CT Images: From the Fleischner Society 2017; Radiology 2017; 284:228-243. Electronically Signed   By: Tollie Eth M.D.   On: 11/21/2018 18:41    ____________________________________________   PROCEDURES  Procedure(s) performed: None  Procedures  Critical Care performed: No ____________________________________________   INITIAL IMPRESSION / ASSESSMENT AND PLAN / ED COURSE  Pertinent labs & imaging results that were available during my care of the patient were reviewed by me and  considered in my medical decision making (see chart for details).  38 y.o. of DVT and PE who self discontinued his Xarelto presenting with 3 days of cough and cold symptoms, now with central chest tightness.  Overall, the patient is well-appearing and has a normal cardiopulmonary examination.  His CT scan does not show any blood clot.  His EKG does not show any ischemia and his first troponin is negative.  There is a second troponin pending, but is unlikely that he has ACS or an acute MI today.  The patient likely has a viral URI, versus an influenza-like illness, causing his symptoms.  I have encouraged him to follow-up with his primary care physician to discuss anticoagulation for his previous clots.  I anticipate discharge home if the patient second troponin is negative.  Follow-up instructions as well as return precautions were discussed.  Of note, the patient did have one episode of vomiting during his CT examination, and developed 2 "bumps" on his face.  These are not typical of hives, but I will treat him with a low-dose of Benadryl.  He has no evidence of cardiovascular compromise, anaphylaxis, or true hives.  ----------------------------------------- 9:42 PM on 11/21/2018 -----------------------------------------  The patient continues to feel well in the emergency department.  His influenza testing is negative and a second troponin is negative.  At this time, the patient will be discharged home.  Follow-up instructions as well as return precautions were discussed peer  ____________________________________________  FINAL CLINICAL IMPRESSION(S) / ED DIAGNOSES  Final diagnoses:  Chest tightness  Upper respiratory tract infection, unspecified type         NEW MEDICATIONS STARTED DURING THIS VISIT:  New Prescriptions   No medications on file      Rockne Menghini, MD 11/21/18 2142

## 2018-11-21 NOTE — Discharge Instructions (Addendum)
Please make an appointment with a primary care physician to reestablish care to discuss whether you still need to be on blood thinner therapy.  In addition, you can be reevaluated for your symptoms today.  Return to the emergency department if you develop severe pain, shortness of breath, lightheadedness or fainting, lower leg swelling, or any other symptoms concerning to you.

## 2018-11-21 NOTE — ED Notes (Signed)
Spoke to Dr. Lenard Lance regarding patient's history of PE and patient stating he feels similar to how he did at that time.  Verbal orders for CT angio received.

## 2019-07-25 IMAGING — CT CT ANGIO CHEST
2 of 6 series · 18 of 46 positions shown · IV contrast (APPLIED)
Comparison: Same day CXR and chest CT 04/05/2018

CLINICAL DATA: Chest pain

EXAM:
CT ANGIOGRAPHY CHEST WITH CONTRAST
TECHNIQUE: Multidetector CT imaging of the chest was performed using the
standard protocol during bolus administration of intravenous
contrast. Multiplanar CT image reconstructions and MIPs were
obtained to evaluate the vascular anatomy.
CONTRAST:  75mL OMNIPAQUE IOHEXOL 350 MG/ML SOLN

[Series 5: thins · axial · 0.72mm/px · z∈[-344,-76]mm · 15 of 294 slices shown]
[im 13/294  lung]
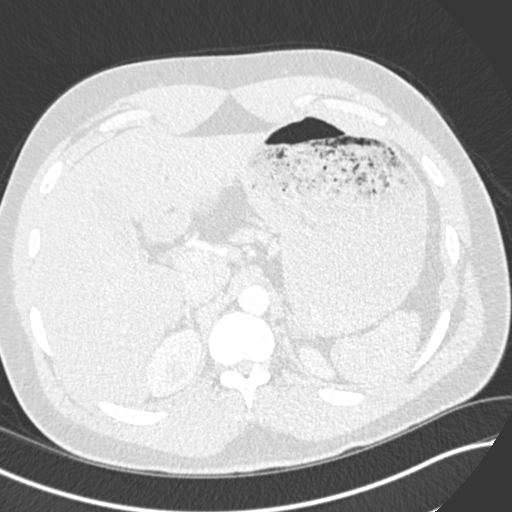
[im 39/294  soft-tissue]
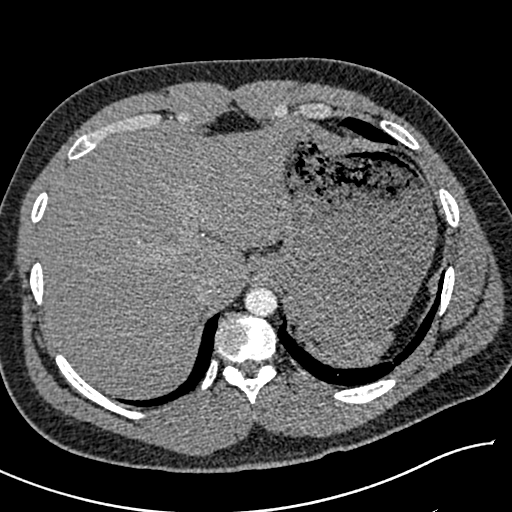
[im 51/294  lung]
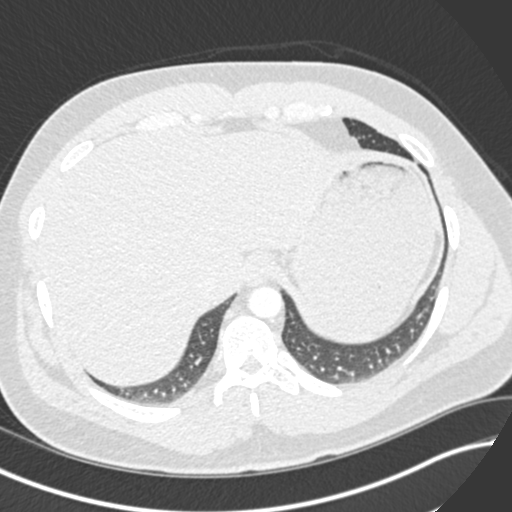
[im 77/294  soft-tissue]
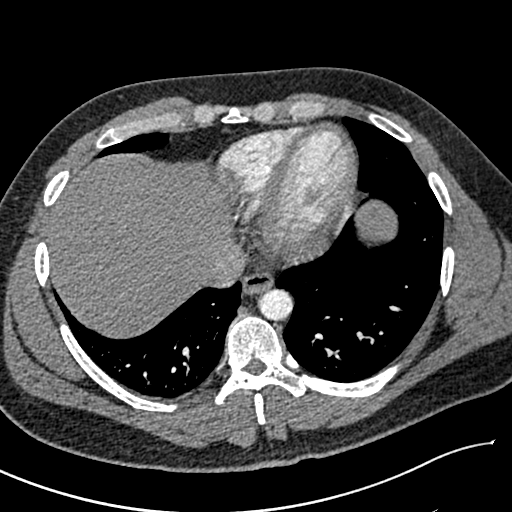
[im 90/294  lung]
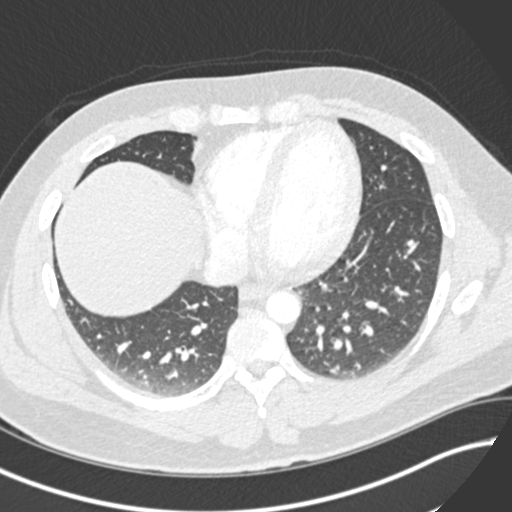
[im 115/294  soft-tissue]
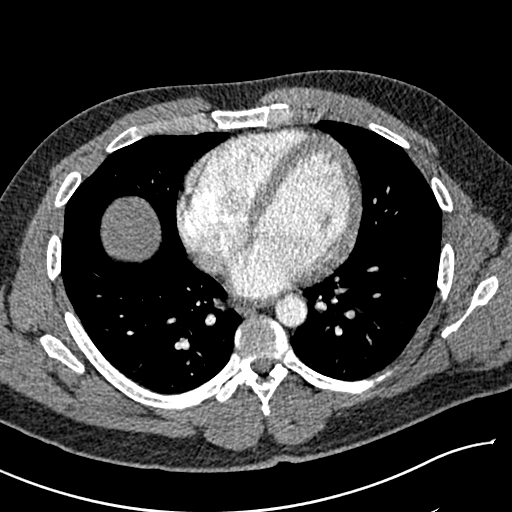
[im 128/294  lung]
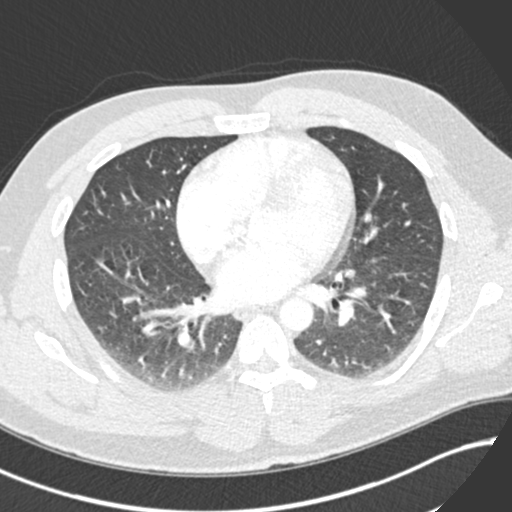
[im 153/294  soft-tissue]
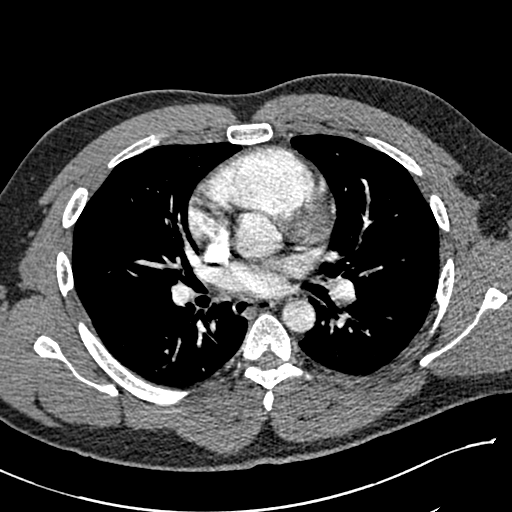
[im 166/294  lung]
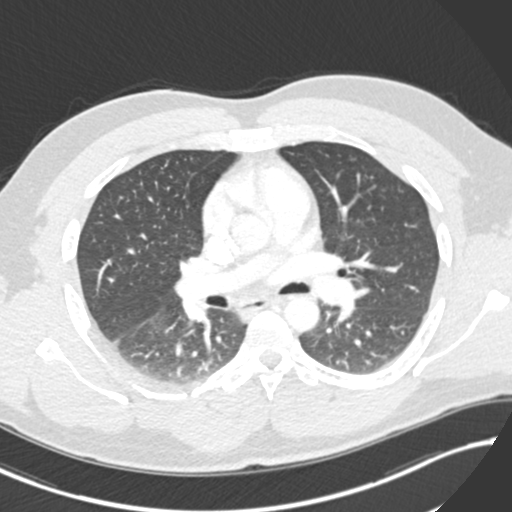
[im 179/294  soft-tissue]
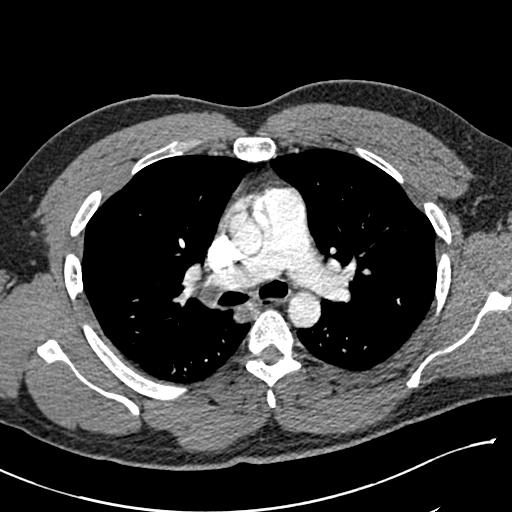
[im 204/294  lung]
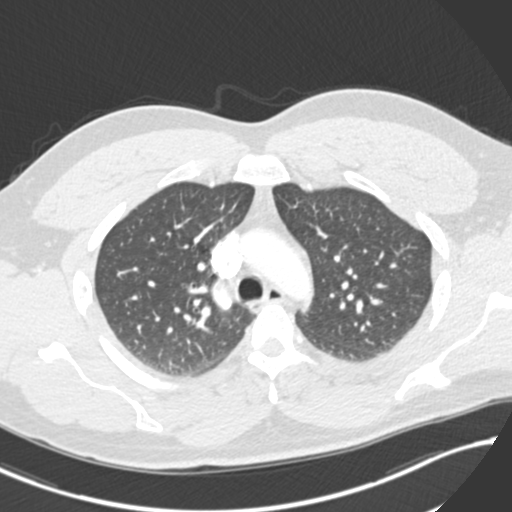
[im 217/294  soft-tissue]
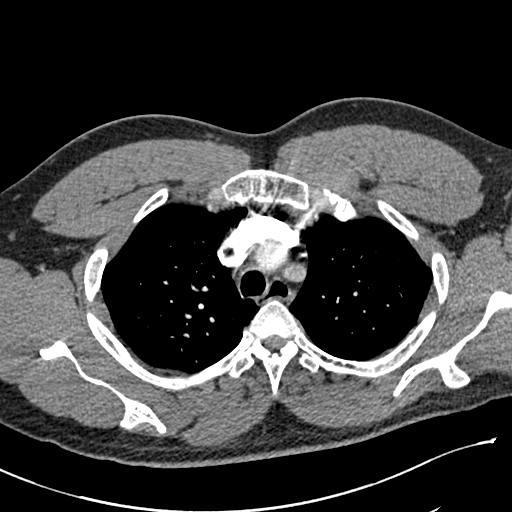
[im 243/294  lung]
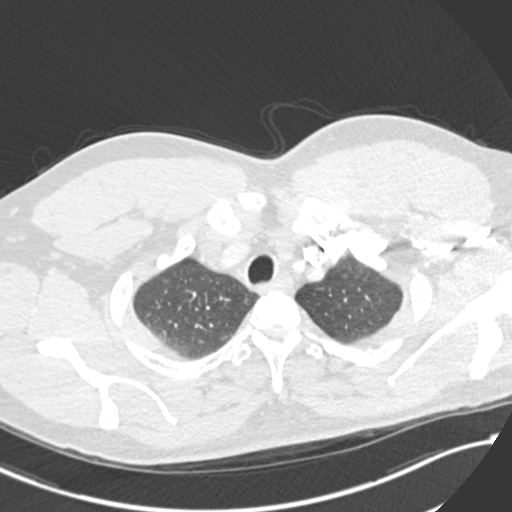
[im 255/294  soft-tissue]
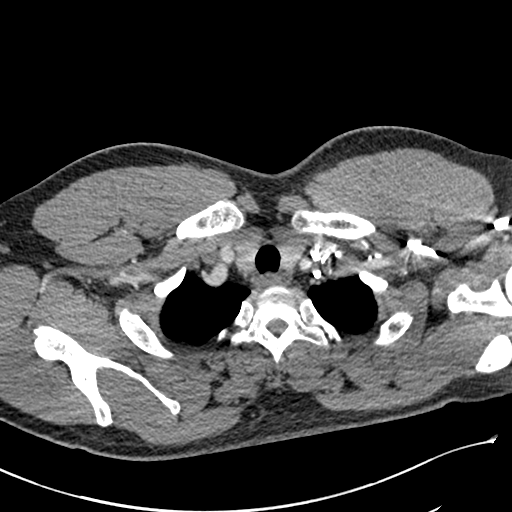
[im 281/294  lung]
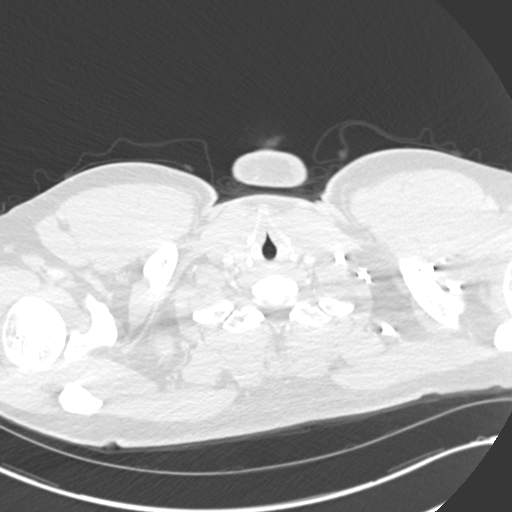

[Series 7: coronal mpr · coronal · 0.57mm/px · 3 of 91 slices shown]
[im 23/91  soft-tissue]
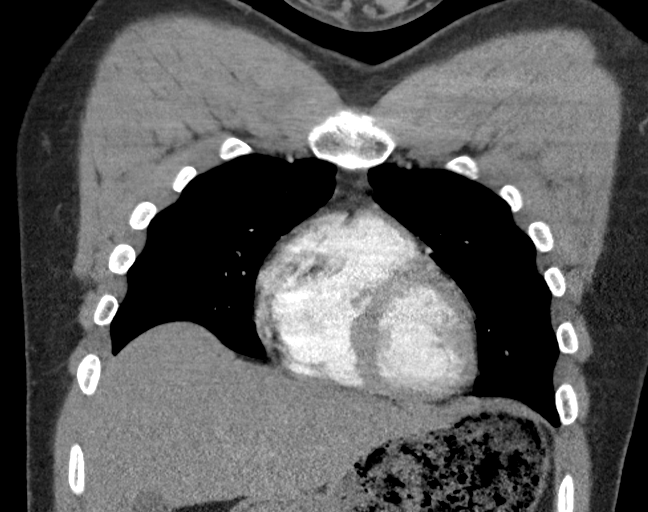
[im 46/91  soft-tissue]
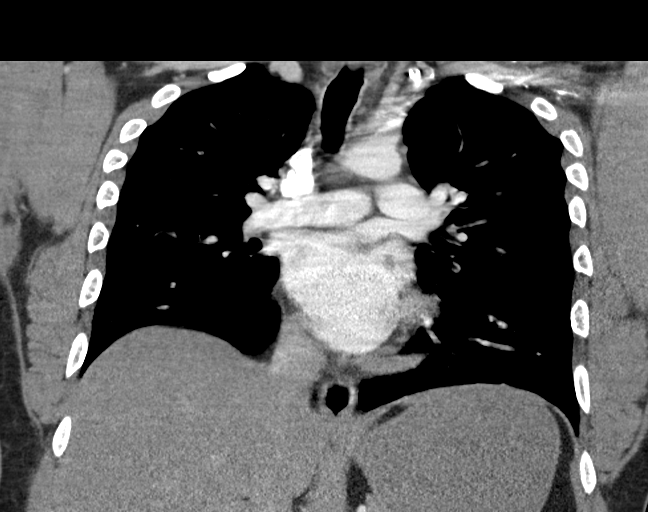
[im 68/91  soft-tissue]
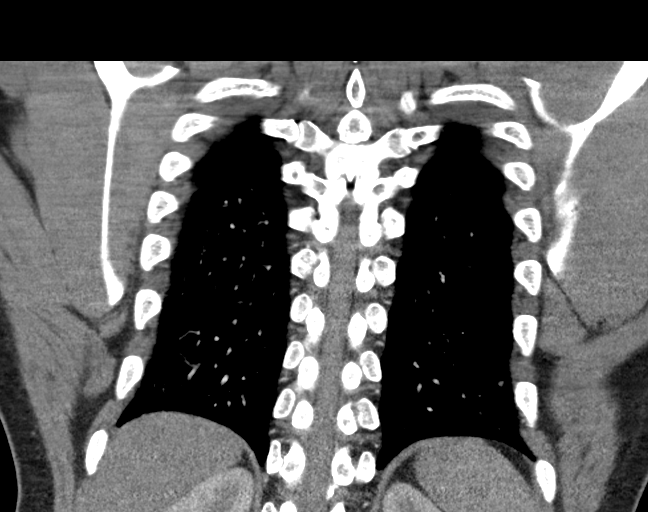

[18 of 46 positions shown; findings below may reference images not displayed]

FINDINGS: Cardiovascular: The study is of quality for the evaluation of
pulmonary embolism. There are no filling defects in the central,
lobar, segmental or subsegmental pulmonary artery branches to
suggest acute pulmonary embolism. Great vessels are normal in course
and caliber. Normal heart size. No significant pericardial
fluid/thickening.

Mediastinum/Nodes: No discrete thyroid nodules. Unremarkable
esophagus. No pathologically enlarged axillary, mediastinal or hilar
lymph nodes.

Lungs/Pleura: No pneumothorax. No pleural effusion. Tiny 3 mm
nodular density along the major fissure on the right likely to
represent a small fissural lymph node as well as along the minor
fissure, series [DATE] and 50.. Subpleural right upper lobe 3 mm
nodular density is also identified series [DATE] unchanged in
appearance. No dominant mass is seen. Bibasilar dependent
atelectasis is noted.

Upper abdomen: Unremarkable.

Musculoskeletal:  No aggressive appearing focal osseous lesions.

Review of the MIP images confirms the above findings.
IMPRESSION: 1. No acute pulmonary embolus, aortic aneurysm or dissection.
2. Stable 3 mm or less nodular densities in the right lung as above
described. No follow-up needed if patient is low-risk (and has no
known or suspected primary neoplasm). Non-contrast chest CT can be
considered in 12 months if patient is high-risk. This recommendation
follows the consensus statement: Guidelines for Management of
Incidental Pulmonary Nodules Detected on CT Images: From the

## 2019-08-22 ENCOUNTER — Emergency Department
Admission: EM | Admit: 2019-08-22 | Discharge: 2019-08-22 | Disposition: A | Discharge status: Home / Self Care | Payer: Self-pay | Attending: Emergency Medicine | Admitting: Emergency Medicine

## 2019-08-22 ENCOUNTER — Other Ambulatory Visit: Payer: Self-pay

## 2019-08-22 DIAGNOSIS — Y939 Activity, unspecified: Secondary | ICD-10-CM | POA: Insufficient documentation

## 2019-08-22 DIAGNOSIS — Z7901 Long term (current) use of anticoagulants: Secondary | ICD-10-CM | POA: Insufficient documentation

## 2019-08-22 DIAGNOSIS — I82492 Acute embolism and thrombosis of other specified deep vein of left lower extremity: Secondary | ICD-10-CM | POA: Insufficient documentation

## 2019-08-22 DIAGNOSIS — X58XXXA Exposure to other specified factors, initial encounter: Secondary | ICD-10-CM | POA: Insufficient documentation

## 2019-08-22 DIAGNOSIS — K0889 Other specified disorders of teeth and supporting structures: Secondary | ICD-10-CM | POA: Insufficient documentation

## 2019-08-22 DIAGNOSIS — S025XXA Fracture of tooth (traumatic), initial encounter for closed fracture: Secondary | ICD-10-CM | POA: Insufficient documentation

## 2019-08-22 DIAGNOSIS — Y999 Unspecified external cause status: Secondary | ICD-10-CM | POA: Insufficient documentation

## 2019-08-22 DIAGNOSIS — F17210 Nicotine dependence, cigarettes, uncomplicated: Secondary | ICD-10-CM | POA: Insufficient documentation

## 2019-08-22 DIAGNOSIS — Y929 Unspecified place or not applicable: Secondary | ICD-10-CM | POA: Insufficient documentation

## 2019-08-22 DIAGNOSIS — I2699 Other pulmonary embolism without acute cor pulmonale: Secondary | ICD-10-CM | POA: Insufficient documentation

## 2019-08-22 MED ORDER — AMOXICILLIN-POT CLAVULANATE 875-125 MG PO TABS: 1.0000 | ORAL_TABLET | Freq: Two times a day (BID) | ORAL | 0 refills | Status: AC

## 2019-08-22 MED ORDER — AMOXICILLIN-POT CLAVULANATE 875-125 MG PO TABS
1.0000 | ORAL_TABLET | Freq: Once | ORAL | Status: AC
  Administered 2019-08-22: 23:00:00 1 via ORAL
  Filled 2019-08-22: qty 1

## 2019-08-22 MED ORDER — IBUPROFEN 600 MG PO TABS: 600.0000 mg | ORAL_TABLET | Freq: Four times a day (QID) | ORAL | 0 refills | Status: DC | PRN

## 2019-08-22 MED ORDER — TRAMADOL HCL 50 MG PO TABS: 50.0000 mg | ORAL_TABLET | Freq: Four times a day (QID) | ORAL | 0 refills | Status: DC | PRN

## 2019-08-22 MED ORDER — TRAMADOL HCL 50 MG PO TABS
100.0000 mg | ORAL_TABLET | Freq: Once | ORAL | Status: AC
  Administered 2019-08-22: 100 mg via ORAL
  Filled 2019-08-22: qty 2

## 2019-08-22 MED ORDER — IBUPROFEN 800 MG PO TABS
800.0000 mg | ORAL_TABLET | Freq: Once | ORAL | Status: AC
  Administered 2019-08-22: 800 mg via ORAL
  Filled 2019-08-22: qty 1

## 2019-08-22 NOTE — Discharge Instructions (Signed)
Please take antibiotics as prescribed.  Use pain medication as needed for pain.  Please refer to dental handout with locations to follow-up for dental care.  Return to the ER for any worsening symptoms or urgent changes in health.

## 2019-08-22 NOTE — ED Triage Notes (Signed)
Patient to ED with complaint of a "hole in his left lower tooth". Started causing pain today. Patient unable to get relief with OTC medications.

## 2019-08-22 NOTE — ED Provider Notes (Signed)
[place] EMERGENCY DEPARTMENT Provider Note   CSN: [id number] Arrival date & time: 08/22/19  2143     History   Chief Complaint Chief Complaint  Patient presents with  . Dental Pain    HPI Sergio Ramirez is a 38 y.o. male presents to the emergency department for evaluation of dental pain.  Left lower second molar with pain and discomfort.  Patient states he has had a cavity to this area for quite some time.  Denies any recent trauma or injury.  No facial swelling, fevers or difficulty swallowing.  Patient's pain is been moderate to severe and unable to be relieved with Tylenol and ibuprofen.     HPI  Past Medical History:  Diagnosis Date  . DVT (deep venous thrombosis) ([place])   . Pulmonary embolism [facility])     Patient Active Problem List   Diagnosis Date Noted  . Deep vein thrombosis (DVT) of left lower extremity ([place])   . Bilateral pulmonary embolism ([place]) 12/03/2017    History reviewed. No pertinent surgical history.      Home Medications    Prior to Admission medications   Medication Sig Start Date End Date Taking? Authorizing Provider  albuterol (PROVENTIL HFA;VENTOLIN HFA) 108 (90 Base) MCG/ACT inhaler Inhale 2 puffs into the lungs every 6 (six) hours as needed for wheezing or shortness of breath. 02/10/18   Arta Silence, MD  amoxicillin-clavulanate (AUGMENTIN) 875-125 MG tablet Take 1 tablet by mouth every 12 (twelve) hours for 7 days. 08/22/19 08/29/19  Duanne Guess, PA-C  ibuprofen (ADVIL) 600 MG tablet Take 1 tablet (600 mg total) by mouth every 6 (six) hours as needed for moderate pain. 08/22/19   Duanne Guess, PA-C  rivaroxaban (XARELTO) 20 MG TABS tablet Take 1 tablet (20 mg total) by mouth daily with supper. Patient not taking: Reported on 02/10/2018 01/03/18   Earlie Server, MD  traMADol (ULTRAM) 50 MG tablet Take 1 tablet (50 mg total) by mouth every 6 (six) hours as needed. 08/22/19 08/21/20  Duanne Guess, PA-C    Family  History No family history on file.  Social History Social History   Tobacco Use  . Smoking status: Current Every Day Smoker    Packs/day: 0.20    Years: 2.00    Pack years: 0.40    Types: Cigarettes  . Smokeless tobacco: Never Used  Substance Use Topics  . Alcohol use: Yes    Alcohol/week: 22.0 standard drinks    Types: 12 Cans of beer, 10 Shots of liquor per week    Comment: occasionally  . Drug use: Yes    Types: Marijuana     Allergies   Patient has no known allergies.   Review of Systems Review of Systems  Constitutional: Negative.  Negative for chills and fever.  HENT: Positive for dental problem. Negative for drooling, facial swelling, mouth sores, trouble swallowing and voice change.   Respiratory: Negative for shortness of breath.   Cardiovascular: Negative for chest pain.  Gastrointestinal: Negative for nausea and vomiting.  Musculoskeletal: Negative for arthralgias, neck pain and neck stiffness.  Skin: Negative.   Psychiatric/Behavioral: Negative for confusion.  All other systems reviewed and are negative.    Physical Exam Updated Vital Signs BP (!) 146/80 (BP Location: Right Arm)   Pulse 77   Temp 98.7 F (37.1 C) (Oral)   Resp 20   Ht 6\' 3"  (1.905 m)   Wt 113.4 kg   BMI 31.25 kg/m   Physical Exam  Constitutional:      General: He is not in acute distress.    Appearance: He is well-developed.  HENT:     Head: Normocephalic and atraumatic.     Jaw: No trismus.     Right Ear: External ear normal.     Left Ear: External ear normal.     Nose: Nose normal.     Mouth/Throat:     Mouth: No oral lesions.     Dentition: Normal dentition.     Pharynx: Uvula midline. No uvula swelling.   Neck:     Musculoskeletal: Normal range of motion and neck supple.  Cardiovascular:     Rate and Rhythm: Normal rate.     Heart sounds: No murmur. No friction rub. No gallop.   Pulmonary:     Effort: Pulmonary effort is normal. No respiratory distress.      Breath sounds: Normal breath sounds.  Skin:    General: Skin is warm and dry.  Neurological:     Mental Status: He is alert and oriented to person, place, and time.  Psychiatric:        Behavior: Behavior normal.        Thought Content: Thought content normal.      ED Treatments / Results  Labs (all labs ordered are listed, but only abnormal results are displayed) Labs Reviewed - No data to display  EKG None  Radiology No results found.  Procedures Procedures (including critical care time)  Medications Ordered in ED Medications  traMADol (ULTRAM) tablet 100 mg (100 mg Oral Given 08/22/19 2236)  amoxicillin-clavulanate (AUGMENTIN) 875-125 MG per tablet 1 tablet (1 tablet Oral Given 08/22/19 2236)  ibuprofen (ADVIL) tablet 800 mg (800 mg Oral Given 08/22/19 2236)     Initial Impression / Assessment and Plan / ED Course  I have reviewed the triage vital signs and the nursing notes.  Pertinent labs & imaging results that were available during my care of the patient were reviewed by me and considered in my medical decision making (see chart for details).       38 year old male with dental pain.  No facial swelling, fevers, trismus.  Vital signs are stable.  Tolerating p.o. well.  No sign of abscess formation.  Given prescription for Augmentin, tramadol, ibuprofen.  He is given information on local dental clinics to follow-up with.  He understands signs and return to ED for. Final Clinical Impressions(s) / ED Diagnoses   Final diagnoses:  Pain, dental  Closed fracture of tooth, initial encounter    ED Discharge Orders         Ordered    traMADol (ULTRAM) 50 MG tablet  Every 6 hours PRN     08/22/19 2237    amoxicillin-clavulanate (AUGMENTIN) 875-125 MG tablet  Every 12 hours     08/22/19 2237    ibuprofen (ADVIL) 600 MG tablet  Every 6 hours PRN     08/22/19 2237           Evon Slack, PA-C 08/22/19 2243    Shaune Pollack, MD 08/24/19 [phone]

## 2019-11-15 ENCOUNTER — Emergency Department: Payer: Self-pay

## 2019-11-15 ENCOUNTER — Emergency Department
Admission: EM | Admit: 2019-11-15 | Discharge: 2019-11-15 | Disposition: A | Discharge status: Home / Self Care | Payer: Self-pay | Attending: Emergency Medicine | Admitting: Emergency Medicine

## 2019-11-15 ENCOUNTER — Other Ambulatory Visit: Payer: Self-pay

## 2019-11-15 DIAGNOSIS — M25571 Pain in right ankle and joints of right foot: Secondary | ICD-10-CM | POA: Insufficient documentation

## 2019-11-15 DIAGNOSIS — F17210 Nicotine dependence, cigarettes, uncomplicated: Secondary | ICD-10-CM | POA: Insufficient documentation

## 2019-11-15 LAB — URIC ACID: Uric Acid, Serum: 7.6 mg/dL (ref 3.7–8.6)

## 2019-11-15 MED ORDER — HYDROCODONE-ACETAMINOPHEN 5-325 MG PO TABS: 1.0000 | ORAL_TABLET | Freq: Three times a day (TID) | ORAL | 0 refills | Status: AC | PRN

## 2019-11-15 MED ORDER — DICLOFENAC SODIUM 50 MG PO TBEC: 50.0000 mg | DELAYED_RELEASE_TABLET | Freq: Two times a day (BID) | ORAL | 0 refills | Status: AC

## 2019-11-15 NOTE — ED Notes (Signed)
Pt refused for ankle ankle to be wrapped here with ace wrap, pt states "i'll wrap it when I get home" RN at bedside to hear pt state this

## 2019-11-15 NOTE — ED Triage Notes (Signed)
Pt c/o right ankle pain since this  Morning, painful to walk on.

## 2019-11-15 NOTE — Discharge Instructions (Addendum)
Your exam and XR are normal for any acute fracture or dislocation. You have a blood test pending which will test for uric acid levels. You can check [facility] MyChart for test results. Take the prescription meds as directed. Follow-up with Dr. Alberteen Spindle for ongoing ankle pain.

## 2019-11-16 NOTE — ED Provider Notes (Signed)
[facility], [facility], [facility] Emergency Department Provider Note ____________________________________________  Time seen: 1833  I have reviewed the triage vital signs and the nursing notes.  HISTORY  Chief Complaint  Ankle Pain  HPI Ameir Faria is a 39 y.o. male patient presents to the ED with right ankle pain since awakening this morning.  Patient describes tenderness to the foot that was immediate upon attempting to place his foot on the floor.  He denies any preceding trauma, accident, or contusion.  He denies any history of chronic ongoing ankle joint pain.  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  Patient admits to discontinuing the medication after the prescription ran out.  He has not had follow-up in the interim but he denies any chest pain, shortness of breath, calf pain, or claudication pain.  He presents to the ED for acute right ankle pain.  Past Medical History:  Diagnosis Date  . DVT (deep venous thrombosis) (HCC)   . Pulmonary embolism [facility])     Patient Active Problem List   Diagnosis Date Noted  . 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 Bilateral pulmonary embolism (HCC) 12/03/2017    No past surgical history on file.  Prior to Admission medications   Medication Sig Start Date End Date Taking? Authorizing Provider  albuterol (PROVENTIL HFA;VENTOLIN HFA) 108 (90 Base) MCG/ACT inhaler Inhale 2 puffs into the lungs every 6 (six) hours as needed for wheezing or shortness of breath. 02/10/18   Dionne Bucy, MD  diclofenac (VOLTAREN) 50 MG EC tablet Take 1 tablet (50 mg total) by mouth 2 (two) times daily for 10 days. 11/15/19 11/25/19  Kijuan Gallicchio, Charlesetta Ivory, PA-C  HYDROcodone-acetaminophen (NORCO) 5-325 MG tablet Take 1 tablet by mouth 3 (three) times daily as needed for up to 3 days. 11/15/19 11/18/19  Dakari Cregger, Charlesetta Ivory, PA-C  ibuprofen (ADVIL) 600 MG tablet Take 1 tablet (600 mg total)  by mouth every 6 (six) hours as needed for moderate pain. 08/22/19   Evon Slack, PA-C  rivaroxaban (XARELTO) 20 MG TABS tablet Take 1 tablet (20 mg total) by mouth daily with supper. Patient not taking: Reported on 02/10/2018 01/03/18   Rickard Patience, MD    Allergies Patient has no known allergies.  No family history on file.  Social History Social History   Tobacco Use  . Smoking status: Current Every Day Smoker    Packs/day: 0.20    Years: 2.00    Pack years: 0.40    Types: Cigarettes  . Smokeless tobacco: Never Used  Substance Use Topics  . Alcohol use: Yes    Alcohol/week: 22.0 standard drinks    Types: 12 Cans of beer, 10 Shots of liquor per week    Comment: occasionally  . Drug use: Yes    Types: Marijuana    Review of Systems  Constitutional: Negative for fever. Cardiovascular: Negative for chest pain. Respiratory: Negative for shortness of breath. Musculoskeletal: Negative for back pain.  Right ankle pain as above. Skin: Negative for rash. Neurological: Negative for headaches, focal weakness or numbness. ____________________________________________  PHYSICAL EXAM:  VITAL SIGNS: ED Triage Vitals  Enc Vitals Group     BP 11/15/19 1613 (!) 141/88     Pulse Rate 11/15/19 1613 82     Resp 11/15/19 1613 17     Temp 11/15/19 1613 99.5 F (37.5 C)     Temp Source 11/15/19 1613 Oral  SpO2 11/15/19 1613 98 %     Weight 11/15/19 1614 250 lb (113.4 kg)     Height 11/15/19 1614 6\' 4"  (1.93 m)     Head Circumference --      Peak Flow --      Pain Score 11/15/19 1616 9     Pain Loc --      Pain Edu? --      Excl. in [place]? --     Constitutional: Alert and oriented. Well appearing and in no distress. Head: Normocephalic and atraumatic. Eyes: Conjunctivae are normal. Normal extraocular movements Cardiovascular: Normal rate, regular rhythm. Normal distal pulses. Respiratory: Normal respiratory effort. No wheezes/rales/rhonchi. Musculoskeletal: Right ankle without  any obvious deformity or dislocation.  No significant erythema, joint effusion, or skin changes.  Patient is exquisitely tender to palpation to the lateral and posterior aspect of the ankle.  He is able to demonstrate normal ankle range of motion.  Nontender with normal range of motion in all extremities.  Neurologic: Antalgic gait without ataxia. Normal speech and language. No gross focal neurologic deficits are appreciated. Skin:  Skin is warm, dry and intact. No rash noted. ____________________________________________   LABS (pertinent positives/negatives) Labs Reviewed  URIC ACID  ____________________________________________   RADIOLOGY  DG Right Ankle IMPRESSION: No acute abnormality noted.  I, Melvenia Needles, personally viewed and evaluated these images (plain radiographs) as part of my medical decision making, as well as reviewing the written report by the radiologist. ____________________________________________  PROCEDURES  Procedures ____________________________________________  INITIAL IMPRESSION / ASSESSMENT AND PLAN / ED COURSE  Patient with ED evaluation of acute right ankle pain without preceding injury or trauma.  Patient's clinical picture is overall concerning for possible acute arthropathy like uric acid elevation.  No pre-existing trauma or peripheral vascular deficit at this time.  Patient has a uric acid pending at the time of discharge.  She will be discharged with prescriptions for anti-inflammatories and pain medicines.  He will follow-up with podiatry for ongoing symptom management.  Lory Galan was evaluated in Emergency Department on 11/16/2019 for the symptoms described in the history of present illness. He was evaluated in the context of the global COVID-19 pandemic, which necessitated consideration that the patient might be at risk for infection with the SARS-CoV-2 virus that causes COVID-19. Institutional protocols and algorithms that pertain to  the evaluation of patients at risk for COVID-19 are in a state of rapid change based on information released by regulatory bodies including the CDC and federal and state organizations. These policies and algorithms were followed during the patient's care in the ED.  I reviewed the patient's prescription history over the last 12 months in the multi-state controlled substances database(s) that includes [place], [place], [place], [place], [place], [place], [place], [place], [place], [place], [place], [place], [place], and [place].  Results were notable for no current RX.  ____________________________________________  FINAL CLINICAL IMPRESSION(S) / ED DIAGNOSES  Final diagnoses:  Acute right ankle pain      Jacqui Headen, Dannielle Karvonen, PA-C 11/16/19 1554    Delman Kitten, MD 11/16/19 2321

## 2020-05-19 ENCOUNTER — Emergency Department
Admission: EM | Admit: 2020-05-19 | Discharge: 2020-05-19 | Disposition: A | Discharge status: Home / Self Care | Payer: Self-pay | Attending: Emergency Medicine | Admitting: Emergency Medicine

## 2020-05-19 ENCOUNTER — Encounter: Payer: Self-pay | Admitting: Emergency Medicine

## 2020-05-19 ENCOUNTER — Other Ambulatory Visit: Payer: Self-pay

## 2020-05-19 DIAGNOSIS — K625 Hemorrhage of anus and rectum: Secondary | ICD-10-CM | POA: Insufficient documentation

## 2020-05-19 DIAGNOSIS — F17210 Nicotine dependence, cigarettes, uncomplicated: Secondary | ICD-10-CM | POA: Insufficient documentation

## 2020-05-19 DIAGNOSIS — Z7901 Long term (current) use of anticoagulants: Secondary | ICD-10-CM | POA: Insufficient documentation

## 2020-05-19 LAB — CBC
HCT: 43.9 % (ref 39.0–52.0)
Hemoglobin: 14.3 g/dL (ref 13.0–17.0)
MCH: 29.7 pg (ref 26.0–34.0)
MCHC: 32.6 g/dL (ref 30.0–36.0)
MCV: 91.3 fL (ref 80.0–100.0)
Platelets: 290 10*3/uL (ref 150–400)
RBC: 4.81 MIL/uL (ref 4.22–5.81)
RDW: 12.5 % (ref 11.5–15.5)
WBC: 9.9 10*3/uL (ref 4.0–10.5)
nRBC: 0 % (ref 0.0–0.2)

## 2020-05-19 LAB — COMPREHENSIVE METABOLIC PANEL
ALT: 23 U/L (ref 0–44)
AST: 22 U/L (ref 15–41)
Albumin: 4.3 g/dL (ref 3.5–5.0)
Alkaline Phosphatase: 87 U/L (ref 38–126)
Anion gap: 9 (ref 5–15)
BUN: 15 mg/dL (ref 6–20)
CO2: 27 mmol/L (ref 22–32)
Calcium: 8.8 mg/dL — ABNORMAL LOW (ref 8.9–10.3)
Chloride: 104 mmol/L (ref 98–111)
Creatinine, Ser: 1.39 mg/dL — ABNORMAL HIGH (ref 0.61–1.24)
GFR calc Af Amer: >60 mL/min (ref >60)
GFR calc non Af Amer: >60 mL/min (ref >60)
Glucose, Bld: 101 mg/dL — ABNORMAL HIGH (ref 70–99)
Potassium: 4.1 mmol/L (ref 3.5–5.1)
Sodium: 140 mmol/L (ref 135–145)
Total Bilirubin: 1 mg/dL (ref 0.3–1.2)
Total Protein: 7.2 g/dL (ref 6.5–8.1)

## 2020-05-19 LAB — TYPE AND SCREEN
ABO/RH(D): B POS
Antibody Screen: NEGATIVE

## 2020-05-19 NOTE — ED Triage Notes (Signed)
Blood in stool this morning.  AAOx3.  Skin warm and dry. NAD

## 2020-05-19 NOTE — ED Provider Notes (Signed)
[facility] Emergency Department Provider Note   ____________________________________________    I have reviewed the triage vital signs and the nursing notes.   HISTORY  Chief Complaint Blood In Stools     HPI Sergio Ramirez is a 39 y.o. male with history of a DVT who had been on blood thinners in the past but has not been on them in some time presents with complaints of blood in the stool.  Patient reports bright red blood mixed with brown stool today after having bowel movement he noticed this.  He denies abdominal pain.  No nausea or vomiting.  No coffee-ground emesis.  Thus far today he has not had a repeat episode.  Denies a history of hemorrhoids.  Currently feels quite well has no complaints  Past Medical History:  Diagnosis Date  . DVT (deep venous thrombosis) (HCC)   . Pulmonary embolism [facility])     Patient Active Problem List   Diagnosis Date Noted  . 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 Bilateral pulmonary embolism (HCC) 12/03/2017    History reviewed. No pertinent surgical history.  Prior to Admission medications   Medication Sig Start Date End Date Taking? Authorizing Provider  albuterol (PROVENTIL HFA;VENTOLIN HFA) 108 (90 Base) MCG/ACT inhaler Inhale 2 puffs into the lungs every 6 (six) hours as needed for wheezing or shortness of breath. 02/10/18   Dionne Bucy, MD  rivaroxaban (XARELTO) 20 MG TABS tablet Take 1 tablet (20 mg total) by mouth daily with supper. Patient not taking: Reported on 02/10/2018 01/03/18   Rickard Patience, MD     Allergies Patient has no known allergies.  No family history on file.  Social History Social History   Tobacco Use  . Smoking status: Current Every Day Smoker    Packs/day: 0.20    Years: 2.00    Pack years: 0.40    Types: Cigarettes  . Smokeless tobacco: Never Used  Vaping Use  . Vaping Use: Never used  Substance Use Topics  . Alcohol use: Yes    Alcohol/week:  22.0 standard drinks    Types: 12 Cans of beer, 10 Shots of liquor per week    Comment: occasionally  . Drug use: Yes    Types: Marijuana    Review of Systems  Constitutional: No fever/chills Eyes: No visual changes.  ENT: No sore throat. Cardiovascular: Denies chest pain. Respiratory: Denies shortness of breath. Gastrointestinal: As above Genitourinary: Negative for dysuria. Musculoskeletal: Negative for back pain. Skin: Negative for rash. Neurological: Negative for headaches or weakness   ____________________________________________   PHYSICAL EXAM:  VITAL SIGNS: ED Triage Vitals  Enc Vitals Group     BP 05/19/20 0818 (!) 145/99     Pulse Rate 05/19/20 0818 74     Resp 05/19/20 0818 16     Temp 05/19/20 0818 98.6 F (37 C)     Temp Source 05/19/20 0818 Oral     SpO2 05/19/20 0818 99 %     Weight 05/19/20 0816 113.4 kg (250 lb)     Height 05/19/20 0816 1.93 m (6\' 4" )     Head Circumference --      Peak Flow --      Pain Score 05/19/20 0816 0     Pain Loc --      Pain Edu? --      Excl. in [place]? --     Constitutional: Alert and oriented.  Nose: No congestion/rhinnorhea. Mouth/Throat: Mucous membranes  are moist.    Cardiovascular:  Good peripheral circulation. Respiratory: Normal respiratory effort.  No retractions. Gastrointestinal: Soft and nontender. No distention.  No CVA tenderness.  No external hemorrhoids noted  Musculoskeletal: No lower extremity tenderness nor edema.  Warm and well perfused Neurologic:  Normal speech and language. No gross focal neurologic deficits are appreciated.  Skin:  Skin is warm, dry and intact. No rash noted. Psychiatric: Mood and affect are normal. Speech and behavior are normal.  ____________________________________________   LABS (all labs ordered are listed, but only abnormal results are displayed)  Labs Reviewed  COMPREHENSIVE METABOLIC PANEL - Abnormal; Notable for the following components:      Result Value    Glucose, Bld 101 (*)    Creatinine, Ser 1.39 (*)    Calcium 8.8 (*)    All other components within normal limits  CBC  POC OCCULT BLOOD, ED  TYPE AND SCREEN   ____________________________________________  EKG  None ____________________________________________  RADIOLOGY   ____________________________________________   PROCEDURES  Procedure(s) performed: No  Procedures   Critical Care performed: No ____________________________________________   INITIAL IMPRESSION / ASSESSMENT AND PLAN / ED COURSE  Pertinent labs & imaging results that were available during my care of the patient were reviewed by me and considered in my medical decision making (see chart for details).  Patient presents with rectal bleeding as noted above.  Differential includes external versus internal hemorrhoids, less likely diverticulosis given description of stool.  Patient did show me a picture of stool, brown stool, bright red blood.  Strongly suspicious for hemorrhoidal bleeding.  Hemoglobin today is stable, lab work otherwise unremarkable.  Recommend improving stool habits, stool softener, follow-up with GI, return if worsening or continued bleeding.    ____________________________________________   FINAL CLINICAL IMPRESSION(S) / ED DIAGNOSES  Final diagnoses:  Bright red rectal bleeding        Note:  This document was prepared using Dragon voice recognition software and may include unintentional dictation errors.   Jene Every, MD 05/19/20 [phone]

## 2020-07-18 IMAGING — CR DG ANKLE COMPLETE 3+V*R*
1 series · 3 of 3 positions shown · non-contrast
Comparison: None.

CLINICAL DATA: Pain and swelling without focal injury

EXAM:
RIGHT ANKLE - COMPLETE 3+ VIEW

[Series 1: x ankle ap right · 0.14mm/px · 3 of 3 slices shown]
[im 1/3]
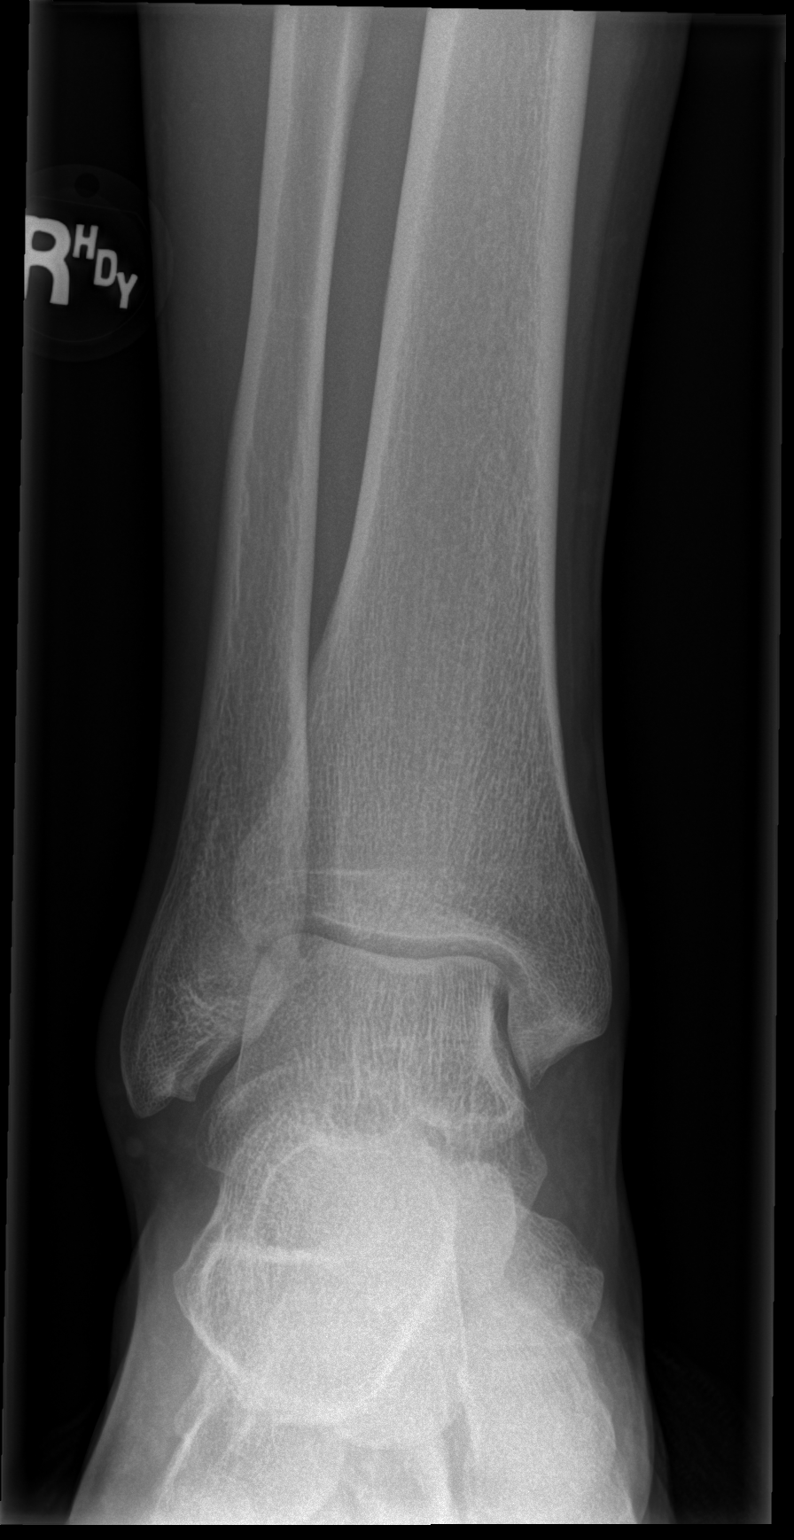
[im 2/3]
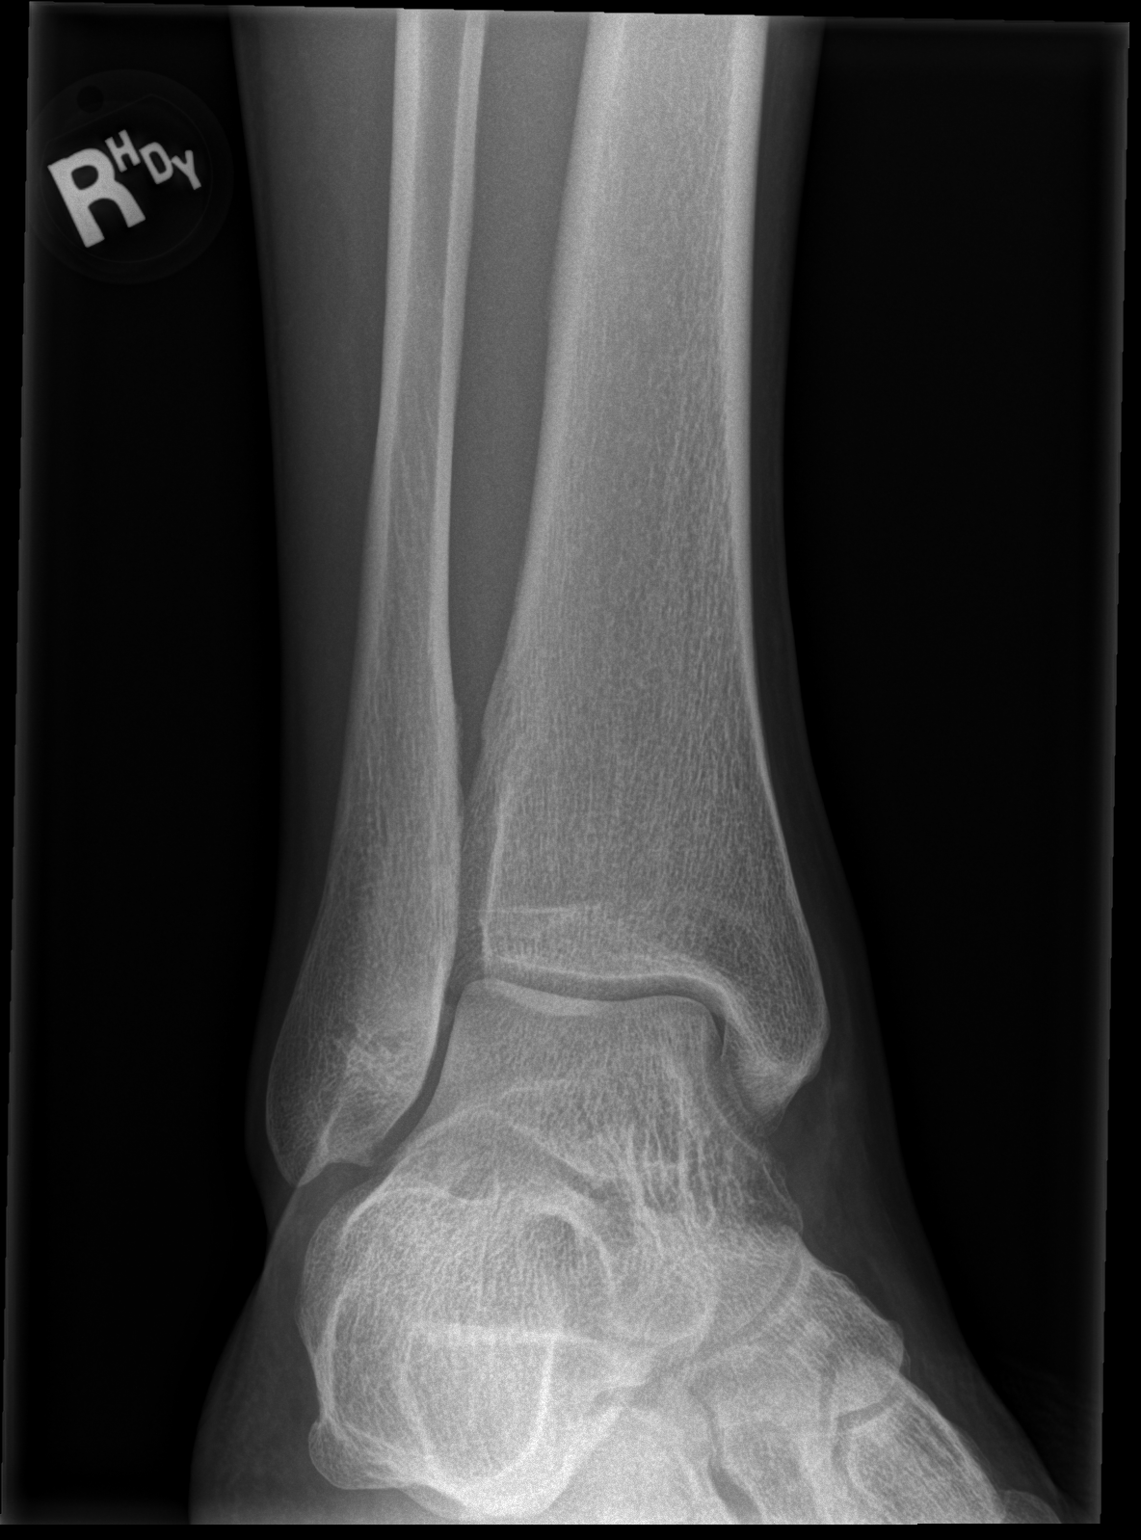
[im 3/3]
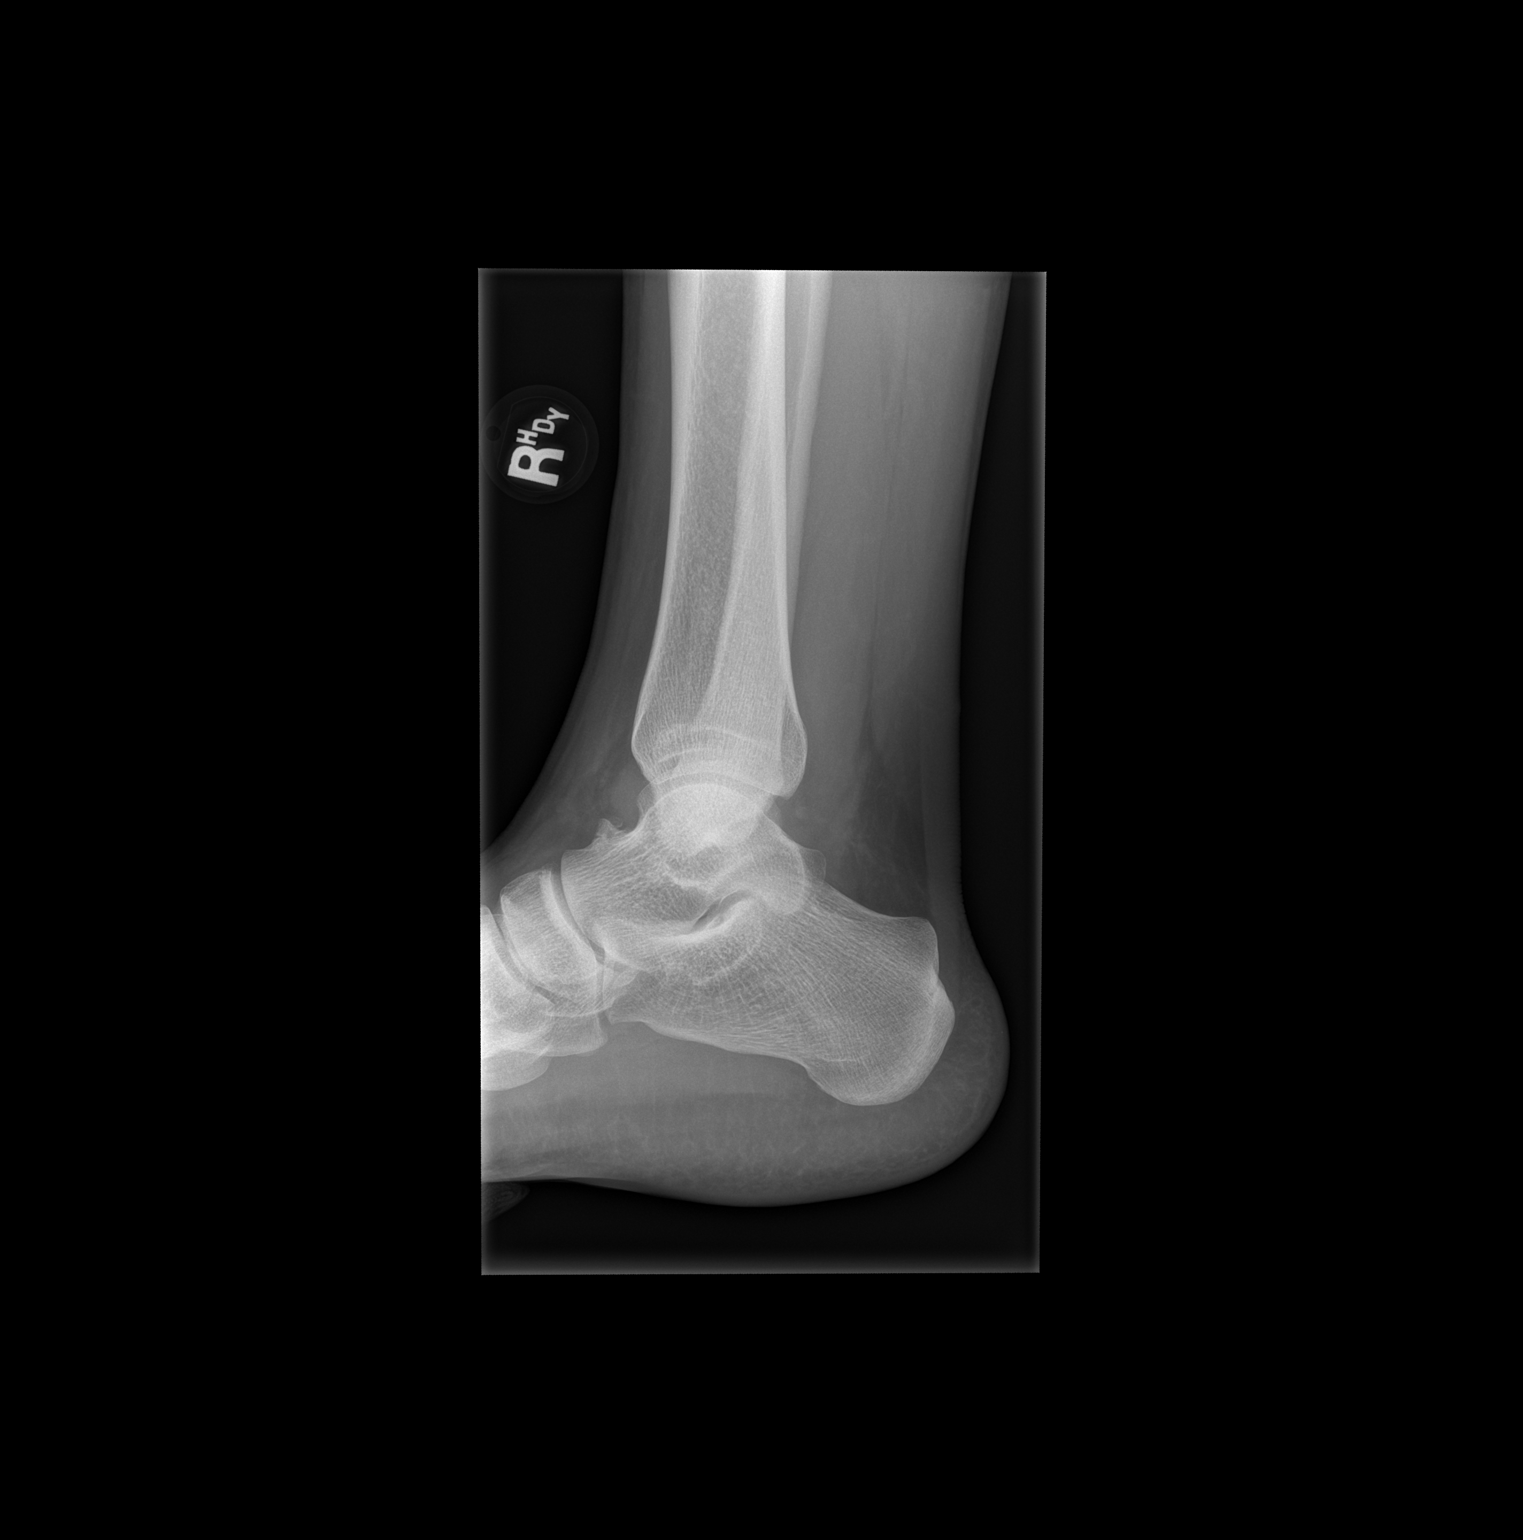

[3 of 3 positions shown; findings below may reference images not displayed]

FINDINGS: There is no evidence of fracture, dislocation, or joint effusion.
There is no evidence of arthropathy or other focal bone abnormality.
Soft tissues are unremarkable.
IMPRESSION: No acute abnormality noted.

## 2022-05-21 ENCOUNTER — Emergency Department
Admission: EM | Admit: 2022-05-21 | Discharge: 2022-05-21 | Disposition: A | Discharge status: Home / Self Care | Payer: 59 | Attending: Emergency Medicine | Admitting: Emergency Medicine

## 2022-05-21 ENCOUNTER — Emergency Department: Payer: 59

## 2022-05-21 ENCOUNTER — Other Ambulatory Visit: Payer: Self-pay

## 2022-05-21 DIAGNOSIS — I82442 Acute embolism and thrombosis of left tibial vein: Secondary | ICD-10-CM | POA: Diagnosis not present

## 2022-05-21 DIAGNOSIS — I82452 Acute embolism and thrombosis of left peroneal vein: Secondary | ICD-10-CM

## 2022-05-21 DIAGNOSIS — I82402 Acute embolism and thrombosis of unspecified deep veins of left lower extremity: Secondary | ICD-10-CM | POA: Diagnosis not present

## 2022-05-21 DIAGNOSIS — M7989 Other specified soft tissue disorders: Secondary | ICD-10-CM | POA: Diagnosis not present

## 2022-05-21 DIAGNOSIS — M79605 Pain in left leg: Secondary | ICD-10-CM | POA: Diagnosis not present

## 2022-05-21 DIAGNOSIS — R600 Localized edema: Secondary | ICD-10-CM | POA: Diagnosis not present

## 2022-05-21 DIAGNOSIS — I82462 Acute embolism and thrombosis of left calf muscular vein: Secondary | ICD-10-CM | POA: Diagnosis not present

## 2022-05-21 DIAGNOSIS — M79662 Pain in left lower leg: Secondary | ICD-10-CM | POA: Diagnosis not present

## 2022-05-21 LAB — COMPREHENSIVE METABOLIC PANEL
ALT: 23 U/L (ref 0–44)
AST: 24 U/L (ref 15–41)
Albumin: 4 g/dL (ref 3.5–5.0)
Alkaline Phosphatase: 45 U/L (ref 38–126)
Anion gap: 6 (ref 5–15)
BUN: 10 mg/dL (ref 6–20)
CO2: 28 mmol/L (ref 22–32)
Calcium: 8.6 mg/dL — ABNORMAL LOW (ref 8.9–10.3)
Chloride: 108 mmol/L (ref 98–111)
Creatinine, Ser: 1.15 mg/dL (ref 0.61–1.24)
GFR, Estimated: >60 mL/min (ref >60)
Glucose, Bld: 97 mg/dL (ref 70–99)
Potassium: 4.6 mmol/L (ref 3.5–5.1)
Sodium: 142 mmol/L (ref 135–145)
Total Bilirubin: 1.2 mg/dL (ref 0.3–1.2)
Total Protein: 6.7 g/dL (ref 6.5–8.1)

## 2022-05-21 LAB — CBC WITH DIFFERENTIAL/PLATELET
Abs Immature Granulocytes: 0.08 10*3/uL — ABNORMAL HIGH (ref 0.00–0.07)
Basophils Absolute: 0.1 10*3/uL (ref 0.0–0.1)
Basophils Relative: 1 %
Eosinophils Absolute: 0.2 10*3/uL (ref 0.0–0.5)
Eosinophils Relative: 2 %
HCT: 45.8 % (ref 39.0–52.0)
Hemoglobin: 14.4 g/dL (ref 13.0–17.0)
Immature Granulocytes: 1 %
Lymphocytes Relative: 25 %
Lymphs Abs: 2.2 10*3/uL (ref 0.7–4.0)
MCH: 28.9 pg (ref 26.0–34.0)
MCHC: 31.4 g/dL (ref 30.0–36.0)
MCV: 92 fL (ref 80.0–100.0)
Monocytes Absolute: 0.5 10*3/uL (ref 0.1–1.0)
Monocytes Relative: 6 %
Neutro Abs: 5.7 10*3/uL (ref 1.7–7.7)
Neutrophils Relative %: 65 %
Platelets: 208 10*3/uL (ref 150–400)
RBC: 4.98 MIL/uL (ref 4.22–5.81)
RDW: 12.2 % (ref 11.5–15.5)
WBC: 8.8 10*3/uL (ref 4.0–10.5)
nRBC: 0 % (ref 0.0–0.2)

## 2022-05-21 LAB — D-DIMER, QUANTITATIVE: D-Dimer, Quant: 0.85 ug/mL-FEU — ABNORMAL HIGH (ref 0.00–0.50)

## 2022-05-21 MED ORDER — IOHEXOL 350 MG/ML SOLN
100.0000 mL | Freq: Once | INTRAVENOUS | Status: AC | PRN
  Administered 2022-05-21: 100 mL via INTRAVENOUS

## 2022-05-21 MED ORDER — HYDROCODONE-ACETAMINOPHEN 5-325 MG PO TABS
1.0000 | ORAL_TABLET | Freq: Once | ORAL | Status: AC
  Administered 2022-05-21: 1 via ORAL
  Filled 2022-05-21: qty 1

## 2022-05-21 MED ORDER — RIVAROXABAN 15 MG PO TABS
15.0000 mg | ORAL_TABLET | Freq: Once | ORAL | Status: AC
  Administered 2022-05-21: 15 mg via ORAL
  Filled 2022-05-21: qty 1

## 2022-05-21 MED ORDER — HYDROCODONE-ACETAMINOPHEN 5-325 MG PO TABS: 1.0000 | ORAL_TABLET | Freq: Four times a day (QID) | ORAL | 0 refills | Status: AC | PRN

## 2022-05-21 MED ORDER — RIVAROXABAN (XARELTO) VTE STARTER PACK (15 & 20 MG): ORAL_TABLET | ORAL | 0 refills | Status: DC

## 2022-05-21 NOTE — ED Provider Notes (Signed)
[facility] Provider Note    Event Date/Time   First MD Initiated Contact with Patient 05/21/22 1024     (approximate)   History   Leg Pain   HPI  Sergio Ramirez is a 41 y.o. male   sent to the ED with complaint of 3 to 4 days of left leg pain.  Patient has history of a DVT in the left leg and also a PE in 2019.  Patient states that he was placed on Xarelto but discontinued it several years ago.  Today he is worried that not only does he have a DVT but possibly a PE as well because of his past history.  He denies any recent injury to his leg, extended travel.  Patient does smoke cigarettes.      Physical Exam   Triage Vital Signs: ED Triage Vitals  Enc Vitals Group     BP 05/21/22 1000 129/89     Pulse Rate 05/21/22 1000 98     Resp 05/21/22 1000 20     Temp 05/21/22 1000 98.7 F (37.1 C)     Temp Source 05/21/22 1000 Oral     SpO2 05/21/22 1000 96 %     Weight 05/21/22 1000 270 lb (122.5 kg)     Height 05/21/22 1000 6\' 3"  (1.905 m)     Head Circumference --      Peak Flow --      Pain Score 05/21/22 0959 6     Pain Loc --      Pain Edu? --      Excl. in [place]? --     Most recent vital signs: Vitals:   05/21/22 1000 05/21/22 1353  BP: 129/89 130/80  Pulse: 98 90  Resp: 20 20  Temp: 98.7 F (37.1 C)   SpO2: 96% 97%     General: Awake, no distress.  Pleasant, able to answer questions in complete sentences without any shortness of breath noted. CV:  Good peripheral perfusion.   Resp:  Normal effort.  Abd:  No distention.  Other:  Left lower extremity with tenderness noted on palpation posterior calf area.  No frank erythema or warmth in comparison with his right lower extremity.  No pitting edema.  PT and DP are present.  No discoloration noted to the lower extremities.  No abrasions or evidence of injury present.  07/22/22' sign slightly positive.  Patient is ambulatory without any assistance.   ED Results / Procedures / Treatments    Labs (all labs ordered are listed, but only abnormal results are displayed) Labs Reviewed  CBC WITH DIFFERENTIAL/PLATELET - Abnormal; Notable for the following components:      Result Value   Abs Immature Granulocytes 0.08 (*)    All other components within normal limits  D-DIMER, QUANTITATIVE - Abnormal; Notable for the following components:   D-Dimer, Quant 0.85 (*)    All other components within normal limits  COMPREHENSIVE METABOLIC PANEL - Abnormal; Notable for the following components:   Calcium 8.6 (*)    All other components within normal limits      RADIOLOGY  Venous ultrasound left lower extremity is positive for an occlusive clot in the posterior tibial and peroneal vein.  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  CT angio chest with no evidence of acute PE.  Tiny bilateral pulmonary nodules since 2020 are consistent with benign etiology.    PROCEDURES:  Critical Care performed:  Procedures   MEDICATIONS ORDERED IN ED: Medications  iohexol (OMNIPAQUE) 350 MG/ML injection 100 mL (100 mLs Intravenous Contrast Given 05/21/22 1353)  Rivaroxaban (XARELTO) tablet 15 mg (15 mg Oral Given 05/21/22 1519)  HYDROcodone-acetaminophen (NORCO/VICODIN) 5-325 MG per tablet 1 tablet (1 tablet Oral Given 05/21/22 1519)     IMPRESSION / MDM / ASSESSMENT AND PLAN / ED COURSE  I reviewed the triage vital signs and the nursing notes.   Differential diagnosis includes, but is not limited to, DVT, left leg pain, muscle strain  41 year old male presents to the ED with complaint of left leg pain for approximately 3 to 4 days without history of injury.  Patient has a history of DVT and also PE in 2019.  Patient states that he took Xarelto after his hospitalization and did see a hematologist/oncologist in the cancer center at [facility] for follow-up.  He admits that he took himself off the Xarelto.  CMP and CBC were reassuring.  D-dimer was elevated at 0.85.   CT angio chest ruled out any evidence of PE and patient was reassured.  We discussed the results of his ultrasound.  Also consulted Dr. Renne Crigler who is on-call for vascular today.  He advises that patient is to start on an anticoagulant and remain on it the rest of his life.  This was in turn conveyed to the patient who now understands that he will never be off of this medication and that he also needs to follow-up with hematology as well as vascular for further studies and evaluation to see if this is a hereditary situation that any children would need to be aware of.  Patient voices understanding and will make the appointments.  Xarelto 15 mg was given to him while in the ED along with a Norco.  He is familiar with the starter pack and will pick this up at his pharmacy along with pain medication.  He is return to the emergency department if any severe worsening of his symptoms such as shortness of breath or increased pain in his leg.      Patient's presentation is most consistent with acute presentation with potential threat to life or bodily function.  FINAL CLINICAL IMPRESSION(S) / ED DIAGNOSES   Final diagnoses:  Acute deep vein thrombosis (DVT) of tibial vein of left lower extremity (HCC)  Acute deep vein thrombosis (DVT) of left peroneal vein (HCC)     Rx / DC Orders   ED Discharge Orders          Ordered    RIVAROXABAN (XARELTO) VTE STARTER PACK (15 & 20 MG)        05/21/22 1511    HYDROcodone-acetaminophen (NORCO/VICODIN) 5-325 MG tablet  Every 6 hours PRN        05/21/22 1519             Note:  This document was prepared using Dragon voice recognition software and may include unintentional dictation errors.   Tommi Rumps, PA-C 05/21/22 1530    Merwyn Katos, MD 05/22/22 [phone]

## 2022-05-21 NOTE — Discharge Instructions (Addendum)
Is important that you call make an appointment with the doctors listed on your discharge papers.  Dr. Cathie Hoops, is the doctor that she saw in the cancer center the last time you had a DVT.  Also Dr. Renne Crigler is the person that you will need to call make an appointment and his contact information and address are listed on your discharge papers.  Is important that you pick up your prescription for the Xarelto starter pack.  Also you will need to take this medication the rest of your life.  A prescription for hydrocodone was sent to the pharmacy to take as needed.  Do not drive or operate machinery while taking pain medication as it could cause drowsiness.

## 2022-05-21 NOTE — ED Notes (Signed)
Pt a/o, no s/s of SOB. Pt c/o leg pain with ambulation

## 2022-05-21 NOTE — ED Triage Notes (Signed)
Pt reports has hx of a DVT in his leg and a few days ago started to have pain in his left leg and is concerned he has another dvt. Pt states it was a while ago so he has not been on blood thinners for awhile.

## 2022-05-22 DIAGNOSIS — I82462 Acute embolism and thrombosis of left calf muscular vein: Secondary | ICD-10-CM | POA: Diagnosis not present

## 2022-05-30 ENCOUNTER — Encounter (INDEPENDENT_AMBULATORY_CARE_PROVIDER_SITE_OTHER): Payer: Self-pay | Admitting: Nurse Practitioner

## 2022-09-05 ENCOUNTER — Encounter (HOSPITAL_COMMUNITY): Payer: Self-pay | Admitting: Emergency Medicine

## 2022-09-05 ENCOUNTER — Other Ambulatory Visit: Payer: Self-pay

## 2022-09-05 ENCOUNTER — Emergency Department (HOSPITAL_COMMUNITY)
Admission: EM | Admit: 2022-09-05 | Discharge: 2022-09-05 | Disposition: A | Discharge status: Home / Self Care | Payer: 59 | Attending: Emergency Medicine | Admitting: Emergency Medicine

## 2022-09-05 ENCOUNTER — Emergency Department (HOSPITAL_BASED_OUTPATIENT_CLINIC_OR_DEPARTMENT_OTHER)
Admit: 2022-09-05 | Discharge: 2022-09-05 | Disposition: A | Discharge status: Home / Self Care | Payer: 59 | Attending: Emergency Medicine | Admitting: Emergency Medicine

## 2022-09-05 DIAGNOSIS — M79609 Pain in unspecified limb: Secondary | ICD-10-CM

## 2022-09-05 DIAGNOSIS — I82452 Acute embolism and thrombosis of left peroneal vein: Secondary | ICD-10-CM

## 2022-09-05 DIAGNOSIS — Z7901 Long term (current) use of anticoagulants: Secondary | ICD-10-CM | POA: Insufficient documentation

## 2022-09-05 DIAGNOSIS — I82451 Acute embolism and thrombosis of right peroneal vein: Secondary | ICD-10-CM | POA: Diagnosis not present

## 2022-09-05 DIAGNOSIS — M79605 Pain in left leg: Secondary | ICD-10-CM | POA: Diagnosis not present

## 2022-09-05 DIAGNOSIS — M79604 Pain in right leg: Secondary | ICD-10-CM | POA: Diagnosis not present

## 2022-09-05 LAB — CBC WITH DIFFERENTIAL/PLATELET
Abs Immature Granulocytes: 0.04 10*3/uL (ref 0.00–0.07)
Basophils Absolute: 0.1 10*3/uL (ref 0.0–0.1)
Basophils Relative: 1 %
Eosinophils Absolute: 0.3 10*3/uL (ref 0.0–0.5)
Eosinophils Relative: 4 %
HCT: 46.4 % (ref 39.0–52.0)
Hemoglobin: 14.8 g/dL (ref 13.0–17.0)
Immature Granulocytes: 1 %
Lymphocytes Relative: 30 %
Lymphs Abs: 2.2 10*3/uL (ref 0.7–4.0)
MCH: 30 pg (ref 26.0–34.0)
MCHC: 31.9 g/dL (ref 30.0–36.0)
MCV: 94.1 fL (ref 80.0–100.0)
Monocytes Absolute: 0.4 10*3/uL (ref 0.1–1.0)
Monocytes Relative: 5 %
Neutro Abs: 4.2 10*3/uL (ref 1.7–7.7)
Neutrophils Relative %: 59 %
Platelets: 328 10*3/uL (ref 150–400)
RBC: 4.93 MIL/uL (ref 4.22–5.81)
RDW: 12.3 % (ref 11.5–15.5)
WBC: 7.2 10*3/uL (ref 4.0–10.5)
nRBC: 0 % (ref 0.0–0.2)

## 2022-09-05 LAB — BASIC METABOLIC PANEL
Anion gap: 11 (ref 5–15)
BUN: 8 mg/dL (ref 6–20)
CO2: 26 mmol/L (ref 22–32)
Calcium: 9 mg/dL (ref 8.9–10.3)
Chloride: 104 mmol/L (ref 98–111)
Creatinine, Ser: 1.24 mg/dL (ref 0.61–1.24)
GFR, Estimated: >60 mL/min (ref >60)
Glucose, Bld: 99 mg/dL (ref 70–99)
Potassium: 4.3 mmol/L (ref 3.5–5.1)
Sodium: 141 mmol/L (ref 135–145)

## 2022-09-05 MED ORDER — RIVAROXABAN (XARELTO) VTE STARTER PACK (15 & 20 MG): ORAL_TABLET | ORAL | 0 refills | Status: DC

## 2022-09-05 MED ORDER — RIVAROXABAN (XARELTO) VTE STARTER PACK (15 & 20 MG)
ORAL_TABLET | ORAL | 0 refills | Status: DC
  Filled 2022-09-05 – 2022-09-14 (×2): qty 51, 30d supply, fill #0

## 2022-09-05 MED ORDER — RIVAROXABAN 15 MG PO TABS
15.0000 mg | ORAL_TABLET | Freq: Once | ORAL | Status: AC
  Administered 2022-09-05: 15 mg via ORAL
  Filled 2022-09-05: qty 1

## 2022-09-05 MED ORDER — RIVAROXABAN (XARELTO) VTE STARTER PACK (15 & 20 MG): ORAL_TABLET | ORAL | 2 refills | Status: DC

## 2022-09-05 NOTE — Care Management (Signed)
ED RNCM met with patient in hal bed 21 concerning medications assistance.  Patient has already used Xarelto card. Discussed using the [facility] for prescriptions. Patient instructed to pick up meds in the am. Also discuss f/u at the [facility]. Patient is agreeable with plan. Updated EDP .

## 2022-09-05 NOTE — Discharge Instructions (Addendum)
Evaluation for your bilateral leg pain revealed that you do have a DVT in your left leg.  Recommend that you restart your rivaroxaban and continue to take it daily as you are at risk for forming DVT and associated pulmonary embolism.  If you have new shortness of breath, chest pain, change in color of your leg or worsening leg pain please return to the emergency department for further evaluation.  Otherwise, strongly advised that you follow-up with your PCP for ongoing DVT management with rivaroxaban.

## 2022-09-05 NOTE — ED Triage Notes (Signed)
Pt. Stated, Im having pain in both lower legs and it feels like when I had a blood clot before.

## 2022-09-05 NOTE — ED Provider Triage Note (Signed)
Emergency Medicine Provider Triage Evaluation Note  Sergio Ramirez , a 41 y.o. male  was evaluated in triage.  Pt complains of bilateral lower leg pain since Saturday last week.  States he was walking when he developed right worse than left calf pain.  Has history of DVT before on the left leg.  No recent injury or travel.  Patient noticed mild swelling to the right calf.  Left calf normal size.  Pain worse with walking.  Improved with resting.  No redness or rash on the skin.  Denies any fever, shortness of breath, chest pain, bowel changes, urinary symptoms.  Review of Systems  Positive: As above Negative: As above  Physical Exam  BP (!) 143/74 (BP Location: Right Arm)   Pulse 92   Temp 98.5 F (36.9 C) (Oral)   Resp 16   Ht 6\' 3"  (1.905 m)   Wt 122.5 kg   SpO2 100%   BMI 33.75 kg/m  Gen:   Awake, no distress   Resp:  Normal effort  MSK:   Moves extremities without difficulty, TTP to right calf and left calf. Other:    Medical Decision Making  Medically screening exam initiated at 11:11 AM.  Appropriate orders placed.  Sergio Ramirez was informed that the remainder of the evaluation will be completed by another provider, this initial triage assessment does not replace that evaluation, and the importance of remaining in the ED until their evaluation is complete.  Work-up initiated   Rex Kras, [place] 09/05/22 1115

## 2022-09-05 NOTE — ED Provider Notes (Signed)
[facility] EMERGENCY DEPARTMENT Provider Note   CSN: [id number] Arrival date & time: 09/05/22  1028     History  Chief Complaint  Patient presents with   Leg Pain   HPI Sergio Ramirez is a 41 y.o. male with history of DVT and PE presenting for leg pain.  Started on Saturday and his right leg.  States he felt "a cramp going up the stairs".  Later that night he woke from sleep and both left and right legs were hurting.  Pain is primarily in the calves.  Had a DVT and PE in 2019 requiring hospitalization.  Was started on Xarelto at that time.  Discontinued Xarelto couple years ago.  Presented to ED in July of this year with leg pain and was diagnosed with new DVT.  Was advised to stay on a blood thinner for the remainder of his life.  Denies chest pain shortness of breath.   Leg Pain      Home Medications Prior to Admission medications   Medication Sig Start Date End Date Taking? Authorizing Provider  albuterol (PROVENTIL HFA;VENTOLIN HFA) 108 (90 Base) MCG/ACT inhaler Inhale 2 puffs into the lungs every 6 (six) hours as needed for wheezing or shortness of breath. Patient not taking: Reported on 05/21/2022 02/10/18   Arta Silence, MD  HYDROcodone-acetaminophen (NORCO/VICODIN) 5-325 MG tablet Take 1 tablet by mouth every 6 (six) hours as needed for moderate pain. 05/21/22 05/21/23  Johnn Hai, PA-C  RIVAROXABAN [employer]) VTE STARTER PACK (15 & 20 MG) Follow package directions: Take one 15mg  tablet by mouth twice a day. On day 22, switch to one 20mg  tablet once a day. Take with food. 09/05/22   Harriet Pho, PA-C      Allergies    Patient has no known allergies.    Review of Systems   Review of Systems  Musculoskeletal:        Bilateral leg pain    Physical Exam Updated Vital Signs BP (!) 133/94 (BP Location: Right Arm)   Pulse 70   Temp 98.8 F (37.1 C) (Oral)   Resp 16   Ht 6\' 3"  (1.905 m)   Wt 122.5 kg   SpO2 99%   BMI 33.75 kg/m   Physical Exam Vitals and nursing note reviewed.  HENT:     Head: Normocephalic and atraumatic.     Mouth/Throat:     Mouth: Mucous membranes are moist.  Eyes:     General:        Right eye: No discharge.        Left eye: No discharge.     Conjunctiva/sclera: Conjunctivae normal.  Cardiovascular:     Rate and Rhythm: Normal rate and regular rhythm.     Pulses: Normal pulses.     Heart sounds: Normal heart sounds.  Pulmonary:     Effort: Pulmonary effort is normal.     Breath sounds: Normal breath sounds.  Abdominal:     General: Abdomen is flat.     Palpations: Abdomen is soft.  Skin:    General: Skin is warm and dry.  Neurological:     General: No focal deficit present.  Psychiatric:        Mood and Affect: Mood normal.     ED Results / Procedures / Treatments   Labs (all labs ordered are listed, but only abnormal results are displayed) Labs Reviewed  CBC WITH DIFFERENTIAL/PLATELET  BASIC METABOLIC PANEL    EKG None  Radiology VAS  [place] LOWER EXTREMITY VENOUS (DVT) (7a-7p)  Result Date: 09/05/2022  Lower Venous DVT Study Patient Name:  Sergio Ramirez  Date of Exam:   09/05/2022 Medical Rec #: [id number]        Accession #:    [id number] Date of Birth: 09/03/1981        Patient Gender: M Patient Age:   66 years Exam Location:  [facility] Procedure:      VAS [place] LOWER EXTREMITY VENOUS (DVT) Referring Phys: KEN LE --------------------------------------------------------------------------------  Indications: Bilateral lower extremity pain. Other Indications: Remote history DVT LLE. Comparison Study: No prior study Performing Technologist: Gertie Fey MHA, RDMS, RVT, RDCS  Examination Guidelines: A complete evaluation includes B-mode imaging, spectral Doppler, color Doppler, and power Doppler as needed of all accessible portions of each vessel. Bilateral testing is considered an integral part of a complete examination. Limited examinations for reoccurring  indications may be performed as noted. The reflux portion of the exam is performed with the patient in reverse Trendelenburg.  +---------+---------------+---------+-----------+----------+--------------+ RIGHT    CompressibilityPhasicitySpontaneityPropertiesThrombus Aging +---------+---------------+---------+-----------+----------+--------------+ CFV      Full           Yes      Yes                                 +---------+---------------+---------+-----------+----------+--------------+ SFJ      Full                                                        +---------+---------------+---------+-----------+----------+--------------+ FV Prox  Full                                                        +---------+---------------+---------+-----------+----------+--------------+ FV Mid   Full                                                        +---------+---------------+---------+-----------+----------+--------------+ FV DistalFull                                                        +---------+---------------+---------+-----------+----------+--------------+ PFV      Full                                                        +---------+---------------+---------+-----------+----------+--------------+ POP      Full           Yes      Yes                                 +---------+---------------+---------+-----------+----------+--------------+  PTV      Full                                                        +---------+---------------+---------+-----------+----------+--------------+ PERO     Full                               dilated                  +---------+---------------+---------+-----------+----------+--------------+   +---------+---------------+---------+-----------+---------------+--------------+ LEFT     CompressibilityPhasicitySpontaneityProperties     Thrombus Aging  +---------+---------------+---------+-----------+---------------+--------------+ CFV      Full           Yes      Yes                                      +---------+---------------+---------+-----------+---------------+--------------+ SFJ      Full                                                             +---------+---------------+---------+-----------+---------------+--------------+ FV Prox  Full                                                             +---------+---------------+---------+-----------+---------------+--------------+ FV Mid   Full                                                             +---------+---------------+---------+-----------+---------------+--------------+ FV DistalFull                                                             +---------+---------------+---------+-----------+---------------+--------------+ PFV      Full                                                             +---------+---------------+---------+-----------+---------------+--------------+ POP      Partial        Yes      Yes        with striationsChronic        +---------+---------------+---------+-----------+---------------+--------------+ PTV      Full                                                             +---------+---------------+---------+-----------+---------------+--------------+  PERO     None                    No                        Acute          +---------+---------------+---------+-----------+---------------+--------------+     Summary: RIGHT: - There is no evidence of deep vein thrombosis in the lower extremity.  - No cystic structure found in the popliteal fossa.  LEFT: - Findings consistent with acute deep vein thrombosis involving the left peroneal veins. - No cystic structure found in the popliteal fossa.  *See table(s) above for measurements and observations. Electronically signed by Waverly Ferrari MD on 09/05/2022  at 4:14:02 PM.    Final     Procedures Procedures    Medications Ordered in ED Medications  Rivaroxaban (XARELTO) tablet 15 mg (has no administration in time range)    ED Course/ Medical Decision Making/ A&P                           Medical Decision Making  This patient presents to the ED for concern of leg pain, this involves a number of treatment options, and is a complaint that carries with it a moderate risk of complications and morbidity.  The differential diagnosis includes DVT, electrolyte derangement and PE.   Co morbidities: Discussed in HPI    EMR reviewed including pt PMHx, past surgical history and past visits to ER.   See HPI for more details   Lab Tests:   I personally reviewed all laboratory work and imaging. Metabolic panel without any acute abnormality specifically kidney function within normal limits and no significant electrolyte abnormalities. CBC without leukocytosis or significant anemia.   Imaging Studies:  Abnormal findings. I personally reviewed all imaging studies. Imaging notable for U/S of left leg  - Findings consistent with acute deep vein thrombosis involving the left peroneal veins.  - No cystic structure found in the popliteal fossa.     Cardiac Monitoring:  The patient was maintained on a cardiac monitor.  I personally viewed and interpreted the cardiac monitored which showed an underlying rhythm of:NSR NA   Medicines ordered:  I ordered medication including rivaroxaban for DVT Reevaluation of the patient after these medicines showed that the patient stayed the same I have reviewed the patients home medicines and have made adjustments as needed    Consults/Attending Physician   I discussed this case with my attending physician who cosigned this note including patient's presenting symptoms, physical exam, and planned diagnostics and interventions. Attending physician stated agreement with plan or made changes to plan which  were implemented.   Reevaluation:  After the interventions noted above I re-evaluated patient and found that they have :stayed the same      Problem List / ED Course:  Patient presented for bilateral leg pain.  Per chart review, patient had DVT and associated PE in 2019.  Was on a course of Xarelto but had discontinued that course 2 or 3 years ago per patient.  Who presented to the ED in July for leg pain.  Was found to have new DVT in the left leg.  Started on Xarelto again and was advised by vascular team that he should remain on it for the rest of his life.  Patient stated that he has been taking his medication but stopped about a week ago.  He ran out of his medication and was having difficulty paying for a new prescription.  Leg pain started shortly after.  Ultrasound today revealed DVT in the left leg.  This is likely due to poor medical compliance in the last couple weeks.  Treated with his home dose of 15 mg of rivaroxaban.  Prescribed rivaroxaban starter pack for home which she is familiar with and has taken in the past and advised to follow-up with his PCP.   Dispostion:  After consideration of the diagnostic results and the patients response to treatment, I feel that the patent would benefit from discharge and reinitiation of his rivaroxaban for ongoing DVT and DVT prophylaxis in the future and follow-up with PCP.         Final Clinical Impression(s) / ED Diagnoses Final diagnoses:  Bilateral leg pain  Deep venous thrombosis (DVT) of left peroneal vein, unspecified chronicity (HCC)    Rx / DC Orders ED Discharge Orders          Ordered    RIVAROXABAN (XARELTO) VTE STARTER PACK (15 & 20 MG)  Status:  Discontinued        09/05/22 1756    RIVAROXABAN (XARELTO) VTE STARTER PACK (15 & 20 MG)        09/05/22 1756              Gareth Eagle, PA-C 09/05/22 1807    Alvira Monday, MD 09/06/22 [phone]

## 2022-09-05 NOTE — Progress Notes (Signed)
Bilateral lower extremity venous duplex completed. Refer to "CV Proc" under chart review to view preliminary results.  09/05/2022 2:30 PM Kelby Aline., MHA, RVT, RDCS, RDMS

## 2022-09-05 NOTE — ED Notes (Signed)
Waiting to speak with case management

## 2022-09-05 NOTE — ED Notes (Signed)
Patient spoke with case management and pharmacist about picking up prescription at [facility] tomorrow morning. Patient states understanding.

## 2022-09-06 ENCOUNTER — Other Ambulatory Visit (HOSPITAL_COMMUNITY): Payer: Self-pay

## 2022-09-07 ENCOUNTER — Other Ambulatory Visit (HOSPITAL_COMMUNITY): Payer: Self-pay

## 2022-09-11 ENCOUNTER — Encounter (INDEPENDENT_AMBULATORY_CARE_PROVIDER_SITE_OTHER): Payer: Self-pay

## 2022-09-14 ENCOUNTER — Other Ambulatory Visit (HOSPITAL_COMMUNITY): Payer: Self-pay

## 2022-09-14 ENCOUNTER — Ambulatory Visit (INDEPENDENT_AMBULATORY_CARE_PROVIDER_SITE_OTHER): Payer: 59 | Admitting: Student

## 2022-09-14 ENCOUNTER — Encounter: Payer: Self-pay | Admitting: Student

## 2022-09-14 VITALS — BP 144/103 | HR 86 | Temp 98.4°F | Ht 76.0 in | Wt 285.1 lb

## 2022-09-14 DIAGNOSIS — I2699 Other pulmonary embolism without acute cor pulmonale: Secondary | ICD-10-CM

## 2022-09-14 DIAGNOSIS — I82532 Chronic embolism and thrombosis of left popliteal vein: Secondary | ICD-10-CM

## 2022-09-14 DIAGNOSIS — R69 Illness, unspecified: Secondary | ICD-10-CM | POA: Diagnosis not present

## 2022-09-14 DIAGNOSIS — R030 Elevated blood-pressure reading, without diagnosis of hypertension: Secondary | ICD-10-CM

## 2022-09-14 DIAGNOSIS — F17210 Nicotine dependence, cigarettes, uncomplicated: Secondary | ICD-10-CM

## 2022-09-14 DIAGNOSIS — Z720 Tobacco use: Secondary | ICD-10-CM

## 2022-09-14 MED ORDER — RIVAROXABAN 20 MG PO TABS
20.0000 mg | ORAL_TABLET | Freq: Every day | ORAL | 11 refills | Status: DC
  Filled 2022-09-14: qty 30, 30d supply, fill #0

## 2022-09-14 NOTE — Progress Notes (Signed)
   CC: DVT, establish care  HPI:  Sergio Ramirez is a 41 y.o. male living with a history stated below and presents today for DVT and establish care. Please see problem based assessment and plan for additional details.  PMH: -DVTs -Hx of PE (2019)  PSH: None  Medications: Xarelto 20 mg daily  Allergies: NKDA  FH: Mother: HTN  SH: -0.25 ppd of cigarettes -occasional alcohol use -occasional marijuana use -works at [employer] [place] [employer]: ROS negative except for what is noted on the assessment and plan.  Vitals:   09/14/22 0859 09/14/22 0926  BP: (!) 151/102 (!) 144/103  Pulse: 84 86  Temp: 98.4 F (36.9 C)   TempSrc: Oral   SpO2: 100%   Weight: 285 lb 1.6 oz (129.3 kg)   Height: 6\' 4"  (1.93 m)    Physical Exam: Constitutional: well-appearing male, sitting in chair, in no acute distress HENT: normocephalic atraumatic Neck: supple Cardiovascular: regular rate and rhythm, no m/r/g Pulmonary/Chest: normal work of breathing on room air, lungs clear to auscultation bilaterally MSK: Left LE: calf tenderness, mild swelling, no lesions or deformity Neurological: alert & oriented x 3 Skin: warm and dry Psych: normal mood and behavior  Assessment & Plan:   Deep vein thrombosis (DVT) of left lower extremity ([place]) Patient with history of recurrent DVTs. In 2019, he was found to have a DVT in left leg and subsequent bilateral PE. Was originally started on Xarelto but self-discontinued it. In 05/2022, after being off Xarelto presented with left calf pain. Was seen [facility], found to have a new DVT in left leg, started on Eliquis per [facility].  He ran out of Eliquis and could not afford the refill. Presented 09/05/2022 to ED for left calf pain again. Ultrasound then showed DVT in left leg. Prescription given for Xarelto starter pack but has not picked it up. Denies chest pain or shortness of breath. Exam showed calf tenderness of left leg.   He did see  hematology in 2019 and had hypercoagulability work-up, which was negative for factor V Leiden and prothrombin mutation. Given multiple DVTs in only his left leg, could consider May-Thurner syndrome. Patient would benefit from seeing hematology and vascular surgery regarding recurrent DVTs. Also, recurrence could be influenced by poor medication adherence.  Plan -Advised patient to pick up and start the Xarelto starter pack today at [place] 20 mg daily to [place] under IM program (He reports being uninsured right now) -Referral to vascular surgery and hematology -Follow-up in 2 weeks  Elevated blood pressure reading BP: (!) 144/103  Repeat BP slightly improved but remains elevated. Patient denies hx of HTN or being on any medications. Denies shortness of breath or chest pain.   Plan -reassess BP at next visit to determine if he has HTN, if so consider starting antihypertensive   Patient seen with Dr. Ronna Polio, D.O. [facility], PGY-1 Phone: [phone] Date 09/14/2022 Time 10:03 PM

## 2022-09-14 NOTE — Assessment & Plan Note (Signed)
BP: (!) 144/103  Repeat BP slightly improved but remains elevated. Patient denies hx of HTN or being on any medications. Denies shortness of breath or chest pain.   Plan -reassess BP at next visit to determine if he has HTN, if so consider starting antihypertensive

## 2022-09-14 NOTE — Assessment & Plan Note (Addendum)
Patient with history of recurrent DVTs. In 2019, he was found to have a DVT in left leg and subsequent bilateral PE. Was originally started on Xarelto but self-discontinued it. In 05/2022, after being off Xarelto presented with left calf pain. Was seen [facility], found to have a new DVT in left leg, started on Eliquis per [facility].  He ran out of Eliquis and could not afford the refill. Presented 09/05/2022 to ED for left calf pain again. Ultrasound then showed DVT in left leg. Prescription given for Xarelto starter pack but has not picked it up. Denies chest pain or shortness of breath. Exam showed calf tenderness of left leg.   He did see hematology in 2019 and had hypercoagulability work-up, which was negative for factor V Leiden and prothrombin mutation. Given multiple DVTs in only his left leg, could consider May-Thurner syndrome. Patient would benefit from seeing hematology and vascular surgery regarding recurrent DVTs. Also, recurrence could be influenced by poor medication adherence.  Plan -Advised patient to pick up and start the Xarelto starter pack today at [place] 20 mg daily to [place] under IM program (He reports being uninsured right now) -Referral to vascular surgery and hematology -Follow-up in 2 weeks

## 2022-09-14 NOTE — Patient Instructions (Signed)
Thank you, Mr.Jaheim Vanlanen for allowing [place] to provide your care today.   Left Leg Blood Clot -Please pick up the Xarelto starter pack at the [facility] today. -Follow up clinic visit in 2 weeks to discuss maintenance Xarelto prescription. -Referral to vascular surgery and hematology regarding your history of blood clots.   Blood Pressure -elevated today -will recheck at next visit  Referrals ordered today:   Referral Orders         Ambulatory referral to Hematology / Oncology         Ambulatory referral to Vascular Surgery      I have ordered the following medication/changed the following medications:   Stop the following medications: There are no discontinued medications.   Start the following medications: Meds ordered this encounter  Medications   rivaroxaban (XARELTO) 20 MG TABS tablet    Sig: Take 1 tablet (20 mg total) by mouth daily with supper.    Dispense:  30 tablet    Refill:  11    IM Program     Follow up:  2 weeks     Should you have any questions or concerns please call the internal medicine clinic at [phone].    Angelique Blonder, D.O. [place]

## 2022-09-15 NOTE — Progress Notes (Signed)
Internal Medicine Clinic Attending  I saw and evaluated the patient.  I personally confirmed the key portions of the history and exam documented by Dr. Zheng and I reviewed pertinent patient test results.  The assessment, diagnosis, and plan were formulated together and I agree with the documentation in the resident's note.  

## 2022-09-27 ENCOUNTER — Encounter: Payer: 59 | Admitting: Vascular Surgery

## 2022-10-30 NOTE — Progress Notes (Deleted)
[facility] [place]  ASSESSMENT / PLAN: 41 y.o. male with multiple recurrent *** deep venous thrombosis, most recently 09/05/22.  CHIEF COMPLAINT: ***  HISTORY OF PRESENT ILLNESS: Sergio Ramirez is a 41 y.o. male ***  VASCULAR SURGICAL HISTORY: ***  VASCULAR RISK FACTORS: {FINDINGS; POSITIVE NEGATIVE:[id number]} history of stroke / transient ischemic attack. {FINDINGS; POSITIVE NEGATIVE:[id number]} history of coronary artery disease. *** history of PCI. *** history of CABG.  {FINDINGS; POSITIVE NEGATIVE:[id number]} history of diabetes mellitus. Last A1c ***. {FINDINGS; POSITIVE NEGATIVE:[id number]} history of smoking. *** actively smoking. {FINDINGS; POSITIVE NEGATIVE:[id number]} history of hypertension. *** drug regimen with *** control. {FINDINGS; POSITIVE NEGATIVE:[id number]} history of chronic kidney disease.  Last GFR ***. CKD {stage:30421363}. {FINDINGS; POSITIVE NEGATIVE:[id number]} history of chronic obstructive pulmonary disease, treated with ***.  FUNCTIONAL STATUS: ECOG performance status: {findings; ecog performance status:31780} Ambulatory status: {TNHAmbulation:25868}  CAREY 1 AND 3 YEAR INDEX Male (2pts) 75-79 or 80-84 (2pts) >84 (3pts) Dependence in toileting (1pt) Partial or full dependence in dressing (1pt) History of malignant neoplasm (2pts) CHF (3pts) COPD (1pts) CKD (3pts)  0-3 pts 6% 1 year mortality ; 21% 3 year mortality 4-5 pts 12% 1 year mortality ; 36% 3 year mortality >5 pts 21% 1 year mortality; 54% 3 year mortality   Past Medical History:  Diagnosis Date   DVT (deep venous thrombosis) (HCC)    DVT (deep venous thrombosis) (HCC)    Pulmonary embolism (HCC)     No past surgical history on file.  No family history on file.  Social History   Socioeconomic History   Marital status: Single    Spouse name: Not on file   Number of children: Not on file   Years of education: Not on file   Highest education  level: Not on file  Occupational History   Not on file  Tobacco Use   Smoking status: Every Day    Packs/day: 0.20    Years: 2.00    Total pack years: 0.40    Types: Cigarettes   Smokeless tobacco: Never  Vaping Use   Vaping Use: Never used  Substance and Sexual Activity   Alcohol use: Yes    Alcohol/week: 22.0 standard drinks of alcohol    Types: 12 Cans of beer, 10 Shots of liquor per week    Comment: occasionally   Drug use: Yes    Types: Marijuana   Sexual activity: Yes    Birth control/protection: Condom  Other Topics Concern   Not on file  Social History Narrative   Not on file   Social Determinants of Health   Financial Resource Strain: Not on file  Food Insecurity: Not on file  Transportation Needs: Not on file  Physical Activity: Not on file  Stress: Not on file  Social Connections: Not on file  Intimate Partner Violence: Not on file    No Known Allergies  Current Outpatient Medications  Medication Sig Dispense Refill   albuterol (PROVENTIL HFA;VENTOLIN HFA) 108 (90 Base) MCG/ACT inhaler Inhale 2 puffs into the lungs every 6 (six) hours as needed for wheezing or shortness of breath. (Patient not taking: Reported on 05/21/2022) 1 Inhaler 0   HYDROcodone-acetaminophen (NORCO/VICODIN) 5-325 MG tablet Take 1 tablet by mouth every 6 (six) hours as needed for moderate pain. 20 tablet 0   rivaroxaban (XARELTO) 20 MG TABS tablet Take 1 tablet (20 mg total) by mouth daily with supper. 30 tablet 11   RIVAROXABAN (XARELTO) VTE STARTER PACK (15 & 20 MG) Follow package directions: Take  one 15mg  tablet by mouth twice a day. On day 22, switch to one 20mg  tablet once a day. Take with food. 51 each 0   RIVAROXABAN (XARELTO) VTE STARTER PACK (15 & 20 MG) Follow package directions: Take one 15mg  tablet by mouth twice a day. On day 22, switch to one 20mg  tablet once a day. Take with food. 51 each 0   No current facility-administered medications for this visit.    PHYSICAL  EXAM There were no vitals filed for this visit.  Constitutional: *** appearing. *** distress. Appears *** nourished.  Neurologic: CN ***. *** focal findings. *** sensory loss. Psychiatric: *** Mood and affect symmetric and appropriate. Eyes: *** No icterus. No conjunctival pallor. Ears, nose, throat: *** mucous membranes moist. Midline trachea.  Cardiac: *** rate and rhythm.  Respiratory: *** unlabored. Abdominal: *** soft, non-tender, non-distended.  Peripheral vascular: *** Extremity: *** edema. *** cyanosis. *** pallor.  Skin: *** gangrene. *** ulceration.  Lymphatic: *** Stemmer's sign. *** palpable lymphadenopathy.    PERTINENT LABORATORY AND RADIOLOGIC DATA  Most recent CBC    Latest Ref Rng & Units 09/05/2022   11:19 AM 05/21/2022   12:29 PM 05/19/2020    8:24 AM  CBC  WBC 4.0 - 10.5 K/uL 7.2  8.8  9.9   Hemoglobin 13.0 - 17.0 g/dL  09/07/2022  07/22/2022   Hematocrit 39.0 - 52.0 % 46.4  45.8  43.9   Platelets 150 - 400 K/uL 328  208  290      Most recent CMP    Latest Ref Rng & Units 09/05/2022   11:19 AM 05/21/2022   12:29 PM 05/19/2020    8:24 AM  CMP  Glucose 70 - 99 mg/dL 99  97  33.8   BUN 6 - 20 mg/dL 8  10  15    Creatinine 0.61 - 1.24 mg/dL 09/07/2022  07/22/2022  07/20/2020   Sodium 135 - 145 mmol/L 141  142  140   Potassium 3.5 - 5.1 mmol/L 4.3  4.6  4.1   Chloride 98 - 111 mmol/L 104  108  104   CO2 22 - 32 mmol/L 26  28  27    Calcium 8.9 - 10.3 mg/dL 9.0  8.6  8.8   Total Protein 6.5 - 8.1 g/dL  6.7  7.2   Total Bilirubin 0.3 - 1.2 mg/dL  1.2  1.0   Alkaline Phos 38 - 126 U/L  45  87   AST 15 - 41 U/L  24  22   ALT 0 - 44 U/L  23  23     Renal function CrCl cannot be calculated (Patient's most recent lab result is older than the maximum 21 days allowed.).  No results found for: "HGBA1C"  No results found for: "LDLCALC", "LDLC", "HIRISKLDL", "POCLDL", "LDLDIRECT", "REALLDLC", "TOTLDLC"   Vascular Imaging: ***  Lilliann Rossetti [address], MD [facility]  of [facility] Phone Number: [phone] 10/30/2022 7:56 AM   Total time spent on preparing this encounter including chart review, data review, collecting history, examining the patient, coordinating care for this {tnhtimebilling:26202}  Portions of this report may have been transcribed using voice recognition software.  Every effort has been made to ensure accuracy; however, inadvertent computerized transcription errors may still be present.

## 2022-10-31 ENCOUNTER — Encounter: Payer: 59 | Admitting: Vascular Surgery

## 2023-08-28 ENCOUNTER — Emergency Department: Payer: Self-pay

## 2023-08-28 ENCOUNTER — Other Ambulatory Visit: Payer: Self-pay

## 2023-08-28 ENCOUNTER — Emergency Department
Admission: EM | Admit: 2023-08-28 | Discharge: 2023-08-29 | Disposition: A | Discharge status: Home / Self Care | Payer: Self-pay | Attending: Emergency Medicine | Admitting: Emergency Medicine

## 2023-08-28 DIAGNOSIS — Z7901 Long term (current) use of anticoagulants: Secondary | ICD-10-CM | POA: Insufficient documentation

## 2023-08-28 DIAGNOSIS — I82431 Acute embolism and thrombosis of right popliteal vein: Secondary | ICD-10-CM

## 2023-08-28 LAB — CBC WITH DIFFERENTIAL/PLATELET
Abs Immature Granulocytes: 0.04 10*3/uL (ref 0.00–0.07)
Basophils Absolute: 0.1 10*3/uL (ref 0.0–0.1)
Basophils Relative: 1 %
Eosinophils Absolute: 0.2 10*3/uL (ref 0.0–0.5)
Eosinophils Relative: 3 %
HCT: 44.1 % (ref 39.0–52.0)
Hemoglobin: 14.2 g/dL (ref 13.0–17.0)
Immature Granulocytes: 1 %
Lymphocytes Relative: 30 %
Lymphs Abs: 2.7 10*3/uL (ref 0.7–4.0)
MCH: 29.9 pg (ref 26.0–34.0)
MCHC: 32.2 g/dL (ref 30.0–36.0)
MCV: 92.8 fL (ref 80.0–100.0)
Monocytes Absolute: 0.7 10*3/uL (ref 0.1–1.0)
Monocytes Relative: 8 %
Neutro Abs: 5.1 10*3/uL (ref 1.7–7.7)
Neutrophils Relative %: 57 %
Platelets: 231 10*3/uL (ref 150–400)
RBC: 4.75 MIL/uL (ref 4.22–5.81)
RDW: 12.3 % (ref 11.5–15.5)
WBC: 8.8 10*3/uL (ref 4.0–10.5)
nRBC: 0 % (ref 0.0–0.2)

## 2023-08-28 LAB — PROTIME-INR
INR: 0.9 (ref 0.8–1.2)
Prothrombin Time: 11.8 s (ref 11.4–15.2)

## 2023-08-28 LAB — BASIC METABOLIC PANEL
Anion gap: 9 (ref 5–15)
BUN: 9 mg/dL (ref 6–20)
CO2: 27 mmol/L (ref 22–32)
Calcium: 8.7 mg/dL — ABNORMAL LOW (ref 8.9–10.3)
Chloride: 103 mmol/L (ref 98–111)
Creatinine, Ser: 1.25 mg/dL — ABNORMAL HIGH (ref 0.61–1.24)
GFR, Estimated: >60 mL/min (ref >60)
Glucose, Bld: 95 mg/dL (ref 70–99)
Potassium: 3.8 mmol/L (ref 3.5–5.1)
Sodium: 139 mmol/L (ref 135–145)

## 2023-08-28 NOTE — ED Provider Notes (Signed)
[facility] Provider Note    Event Date/Time   First MD Initiated Contact with Patient 08/28/23 2310     (approximate)   History   Leg Pain   HPI  Sergio Ramirez is a 42 y.o. male who presents to the ED for evaluation of Leg Pain   Reviewed PCP visit from 1 year ago.  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   Patient presents for evaluation of 3 days of atraumatic right calf pain consistent with previous DVTs.  Has not been on anticoagulation "for a long time" and reports this feels consistent with previous blood clots.  No chest pain, dyspnea, syncope or fever.   Physical Exam   Triage Vital Signs: ED Triage Vitals  Encounter Vitals Group     BP 08/28/23 2104 (!) 174/105     Systolic BP Percentile --      Diastolic BP Percentile --      Pulse Rate 08/28/23 2104 94     Resp 08/28/23 2104 18     Temp 08/28/23 2104 98.5 F (36.9 C)     Temp Source 08/28/23 2104 Oral     SpO2 08/28/23 2104 96 %     Weight 08/28/23 2105 280 lb (127 kg)     Height 08/28/23 2105 6\' 4"  (1.93 m)     Head Circumference --      Peak Flow --      Pain Score 08/28/23 2105 0     Pain Loc --      Pain Education --      Exclude from Growth Chart --     Most recent vital signs: Vitals:   08/28/23 2104  BP: (!) 174/105  Pulse: 94  Resp: 18  Temp: 98.5 F (36.9 C)  SpO2: 96%    General: Awake, no distress.  CV:  Good peripheral perfusion.  Resp:  Normal effort.  Abd:  No distention.  MSK:  No deformity noted.  Mild tenderness to the right calf without any significant skin changes overlying it.  No phlegmasia cerulea dolens. Neuro:  No focal deficits appreciated. Other:     ED Results / Procedures / Treatments   Labs (all labs ordered are listed, but only abnormal results are displayed) Labs Reviewed  BASIC METABOLIC PANEL - Abnormal; Notable for the following components:      Result Value   Creatinine, Ser 1.25 (*)     Calcium 8.7 (*)    All other components within normal limits  CBC WITH DIFFERENTIAL/PLATELET  PROTIME-INR    EKG   RADIOLOGY Venous ultrasound interpreted by me with signs of DVT  Official radiology report(s): US Venous Img Lower Unilateral Right  Result Date: 08/29/2023 CLINICAL DATA:  Right calf pain, swelling EXAM: RIGHT LOWER EXTREMITY VENOUS DOPPLER ULTRASOUND TECHNIQUE: Gray-scale sonography with graded compression, as well as color Doppler and duplex ultrasound were performed to evaluate the lower extremity deep venous systems from the level of the common femoral vein and including the common femoral, femoral, profunda femoral, popliteal and calf veins including the posterior tibial, peroneal and gastrocnemius veins when visible. The superficial great saphenous vein was also interrogated. Spectral Doppler was utilized to evaluate flow at rest and with distal augmentation maneuvers in the common femoral, femoral and popliteal veins. COMPARISON:  None Available. FINDINGS: Contralateral Common Femoral Vein: Respiratory phasicity is normal and symmetric with the symptomatic side. No evidence of thrombus. Normal compressibility. Common Femoral Vein:  No evidence of thrombus. Normal compressibility, respiratory phasicity and response to augmentation. Saphenofemoral Junction: No evidence of thrombus. Normal compressibility and flow on color Doppler imaging. Profunda Femoral Vein: No evidence of thrombus. Normal compressibility and flow on color Doppler imaging. Femoral Vein: No evidence of thrombus. Normal compressibility, respiratory phasicity and response to augmentation. Popliteal Vein: Occlusive thrombus noted in the distal right popliteal vein. Calf Veins: Occlusive thrombus in 1 of the paired peroneal veins. Superficial Great Saphenous Vein: No evidence of thrombus. Normal compressibility. Venous Reflux:  None. Other Findings:  None. IMPRESSION: Occlusive DVT in the right popliteal vein and  peroneal vein. Electronically Signed   By: Charlett Nose M.D.   On: 08/29/2023 02:06    PROCEDURES and INTERVENTIONS:  .Critical Care  Performed by: Delton Prairie, MD Authorized by: Delton Prairie, MD   Critical care provider statement:    Critical care time (minutes):  30   Critical care time was exclusive of:  Separately billable procedures and treating other patients   Critical care was necessary to treat or prevent imminent or life-threatening deterioration of the following conditions:  Circulatory failure   Critical care was time spent personally by me on the following activities:  Development of treatment plan with patient or surrogate, discussions with consultants, evaluation of patient's response to treatment, examination of patient, ordering and review of laboratory studies, ordering and review of radiographic studies, ordering and performing treatments and interventions, pulse oximetry, re-evaluation of patient's condition and review of old charts   Medications  apixaban Everlene Balls) Education Kit for DVT/PE patients ( Does not apply Not Given 08/29/23 0152)  apixaban (ELIQUIS) tablet 10 mg (10 mg Oral Given 08/29/23 0156)     IMPRESSION / MDM / ASSESSMENT AND PLAN / ED COURSE  I reviewed the triage vital signs and the nursing notes.  Differential diagnosis includes, but is not limited to, DVT, cellulitis, muscular spasm  {Patient presents with symptoms of an acute illness or injury that is potentially life-threatening.  Patient with history of DVT presents with evidence of acute DVT suitable for outpatient management.  No symptoms of PE and he is not tachycardic, not hypoxic on room air.  Isolated acute symptoms of DVT in the right leg.  No signs of further complicating features such as superimposed cellulitis or phlegmasia cerulea dolens.  Suitable for outpatient management.  He started on Eliquis.  He has difficulty affording his anticoagulation, provided multiple disc on card to  the pharmacy as well as sending a starter pack to the community pharmacy here at the hospital in hopes that they can fill it at a discounted price or free.  Discussed ED return precautions  Clinical Course as of 08/29/23 0439  Wed Aug 29, 2023  0224 After not finding any 30-day trial/starter kits for Eliquis, I call her pharmacy who also does not have anything [DS]    Clinical Course User Index [DS] Delton Prairie, MD     FINAL CLINICAL IMPRESSION(S) / ED DIAGNOSES   Final diagnoses:  Acute deep vein thrombosis (DVT) of popliteal vein of right lower extremity (HCC)     Rx / DC Orders   ED Discharge Orders          Ordered    APIXABAN (ELIQUIS) VTE STARTER PACK (10MG  AND 5MG )       Note to Pharmacy: If starter pack unavailable, substitute with seventy-four 5 mg apixaban tabs following the above SIG directions.   08/29/23 0124    APIXABAN (ELIQUIS) VTE STARTER PACK (  10MG  AND 5MG )       Note to Pharmacy: If starter pack unavailable, substitute with seventy-four 5 mg apixaban tabs following the above SIG directions.   08/29/23 0225    apixaban (ELIQUIS) 5 MG TABS tablet  2 times daily        08/29/23 0225             Note:  This document was prepared using Dragon voice recognition software and may include unintentional dictation errors.   Delton Prairie, MD 08/29/23 [phone]

## 2023-08-28 NOTE — ED Triage Notes (Signed)
Patient ambulatory to triage with complaints of right leg pain and swelling in the calf x3 days. Previous hx of blood clots in the leg and states this feels similar. Is not currently on blood thinners. Denies chest pain, does state he feels a bit short of breath when up and walking but not like he did when he last had PE's and was on heparin.

## 2023-08-29 ENCOUNTER — Other Ambulatory Visit: Payer: Self-pay

## 2023-08-29 MED ORDER — APIXABAN 5 MG PO TABS
10.0000 mg | ORAL_TABLET | Freq: Once | ORAL | Status: AC
  Administered 2023-08-29: 10 mg via ORAL
  Filled 2023-08-29: qty 2

## 2023-08-29 MED ORDER — APIXABAN 5 MG PO TABS
5.0000 mg | ORAL_TABLET | Freq: Two times a day (BID) | ORAL | 1 refills | Status: DC
  Filled 2023-08-29: qty 60, 30d supply, fill #0

## 2023-08-29 MED ORDER — APIXABAN (ELIQUIS) VTE STARTER PACK (10MG AND 5MG)
ORAL_TABLET | ORAL | 0 refills | Status: DC
  Filled 2023-08-29: qty 74, 28d supply, fill #0

## 2023-08-29 MED ORDER — APIXABAN (ELIQUIS) EDUCATION KIT FOR DVT/PE PATIENTS
PACK | Freq: Once | Status: DC
  Filled 2023-08-29: qty 1

## 2023-08-29 MED ORDER — APIXABAN (ELIQUIS) VTE STARTER PACK (10MG AND 5MG): ORAL_TABLET | ORAL | 0 refills | Status: DC

## 2023-08-29 NOTE — Discharge Instructions (Addendum)
Go to the [facility] in the morning, hopefully they can fill your Eliquis prescription for free

## 2023-09-03 ENCOUNTER — Other Ambulatory Visit: Payer: Self-pay

## 2023-09-03 ENCOUNTER — Emergency Department: Payer: BLUE CROSS/BLUE SHIELD

## 2023-09-03 ENCOUNTER — Encounter: Payer: Self-pay | Admitting: Student

## 2023-09-03 ENCOUNTER — Encounter: Payer: Self-pay | Admitting: Emergency Medicine

## 2023-09-03 ENCOUNTER — Inpatient Hospital Stay
Admission: EM | Admit: 2023-09-03 | Discharge: 2023-09-05 | DRG: 176 | Disposition: A | Discharge status: Home / Self Care | Payer: BLUE CROSS/BLUE SHIELD | Attending: Internal Medicine | Admitting: Internal Medicine

## 2023-09-03 DIAGNOSIS — E66813 Obesity, class 3: Secondary | ICD-10-CM | POA: Diagnosis present

## 2023-09-03 DIAGNOSIS — I82432 Acute embolism and thrombosis of left popliteal vein: Secondary | ICD-10-CM | POA: Diagnosis not present

## 2023-09-03 DIAGNOSIS — Z6834 Body mass index (BMI) 34.0-34.9, adult: Secondary | ICD-10-CM | POA: Diagnosis not present

## 2023-09-03 DIAGNOSIS — R000 Tachycardia, unspecified: Secondary | ICD-10-CM | POA: Diagnosis present

## 2023-09-03 DIAGNOSIS — D72829 Elevated white blood cell count, unspecified: Secondary | ICD-10-CM | POA: Diagnosis present

## 2023-09-03 DIAGNOSIS — I82402 Acute embolism and thrombosis of unspecified deep veins of left lower extremity: Secondary | ICD-10-CM | POA: Diagnosis present

## 2023-09-03 DIAGNOSIS — Z1152 Encounter for screening for COVID-19: Secondary | ICD-10-CM | POA: Diagnosis not present

## 2023-09-03 DIAGNOSIS — F17210 Nicotine dependence, cigarettes, uncomplicated: Secondary | ICD-10-CM | POA: Diagnosis present

## 2023-09-03 DIAGNOSIS — Z7901 Long term (current) use of anticoagulants: Secondary | ICD-10-CM | POA: Diagnosis not present

## 2023-09-03 DIAGNOSIS — I82403 Acute embolism and thrombosis of unspecified deep veins of lower extremity, bilateral: Secondary | ICD-10-CM | POA: Diagnosis present

## 2023-09-03 DIAGNOSIS — E66811 Obesity, class 1: Secondary | ICD-10-CM | POA: Diagnosis present

## 2023-09-03 DIAGNOSIS — I2694 Multiple subsegmental pulmonary emboli without acute cor pulmonale: Secondary | ICD-10-CM

## 2023-09-03 DIAGNOSIS — Z79899 Other long term (current) drug therapy: Secondary | ICD-10-CM

## 2023-09-03 DIAGNOSIS — I2699 Other pulmonary embolism without acute cor pulmonale: Secondary | ICD-10-CM | POA: Diagnosis present

## 2023-09-03 DIAGNOSIS — R079 Chest pain, unspecified: Principal | ICD-10-CM

## 2023-09-03 DIAGNOSIS — I2609 Other pulmonary embolism with acute cor pulmonale: Secondary | ICD-10-CM | POA: Diagnosis not present

## 2023-09-03 LAB — TROPONIN I (HIGH SENSITIVITY)
Troponin I (High Sensitivity): 7 ng/L (ref <18)
Troponin I (High Sensitivity): 6 ng/L (ref <18)

## 2023-09-03 LAB — CBC WITH DIFFERENTIAL/PLATELET
Abs Immature Granulocytes: 0.06 10*3/uL (ref 0.00–0.07)
Basophils Absolute: 0.1 10*3/uL (ref 0.0–0.1)
Basophils Relative: 1 %
Eosinophils Absolute: 0.2 10*3/uL (ref 0.0–0.5)
Eosinophils Relative: 2 %
HCT: 46.8 % (ref 39.0–52.0)
Hemoglobin: 15 g/dL (ref 13.0–17.0)
Immature Granulocytes: 1 %
Lymphocytes Relative: 23 %
Lymphs Abs: 2.7 10*3/uL (ref 0.7–4.0)
MCH: 29.8 pg (ref 26.0–34.0)
MCHC: 32.1 g/dL (ref 30.0–36.0)
MCV: 93 fL (ref 80.0–100.0)
Monocytes Absolute: 1 10*3/uL (ref 0.1–1.0)
Monocytes Relative: 9 %
Neutro Abs: 7.6 10*3/uL (ref 1.7–7.7)
Neutrophils Relative %: 64 %
Platelets: 252 10*3/uL (ref 150–400)
RBC: 5.03 MIL/uL (ref 4.22–5.81)
RDW: 12.2 % (ref 11.5–15.5)
WBC: 11.7 10*3/uL — ABNORMAL HIGH (ref 4.0–10.5)
nRBC: 0 % (ref 0.0–0.2)

## 2023-09-03 LAB — BASIC METABOLIC PANEL
Anion gap: 13 (ref 5–15)
BUN: 12 mg/dL (ref 6–20)
CO2: 26 mmol/L (ref 22–32)
Calcium: 8.9 mg/dL (ref 8.9–10.3)
Chloride: 98 mmol/L (ref 98–111)
Creatinine, Ser: 1.35 mg/dL — ABNORMAL HIGH (ref 0.61–1.24)
GFR, Estimated: >60 mL/min (ref >60)
Glucose, Bld: 115 mg/dL — ABNORMAL HIGH (ref 70–99)
Potassium: 3.7 mmol/L (ref 3.5–5.1)
Sodium: 137 mmol/L (ref 135–145)

## 2023-09-03 LAB — PROTIME-INR
INR: 0.9 (ref 0.8–1.2)
Prothrombin Time: 12.7 s (ref 11.4–15.2)

## 2023-09-03 LAB — HEPARIN LEVEL (UNFRACTIONATED): Heparin Unfractionated: >1.1 [IU]/mL — ABNORMAL HIGH (ref 0.30–0.70)

## 2023-09-03 LAB — RESP PANEL BY RT-PCR (RSV, FLU A&B, COVID)  RVPGX2
Influenza A by PCR: NEGATIVE
Influenza B by PCR: NEGATIVE
Resp Syncytial Virus by PCR: NEGATIVE
SARS Coronavirus 2 by RT PCR: NEGATIVE

## 2023-09-03 LAB — HIV ANTIBODY (ROUTINE TESTING W REFLEX): HIV Screen 4th Generation wRfx: NONREACTIVE

## 2023-09-03 LAB — BRAIN NATRIURETIC PEPTIDE: B Natriuretic Peptide: 15.3 pg/mL (ref 0.0–100.0)

## 2023-09-03 LAB — LACTIC ACID, PLASMA: Lactic Acid, Venous: 1.7 mmol/L (ref 0.5–1.9)

## 2023-09-03 LAB — APTT
aPTT: 32 s (ref 24–36)
aPTT: 33 s (ref 24–36)
aPTT: 52 s — ABNORMAL HIGH (ref 24–36)

## 2023-09-03 MED ORDER — OXYCODONE HCL 5 MG PO TABS
5.0000 mg | ORAL_TABLET | ORAL | Status: DC | PRN
  Administered 2023-09-03: 5 mg via ORAL
  Filled 2023-09-03: qty 1

## 2023-09-03 MED ORDER — HEPARIN BOLUS VIA INFUSION
6000.0000 [IU] | Freq: Once | INTRAVENOUS | Status: AC
  Administered 2023-09-03: 6000 [IU] via INTRAVENOUS
  Filled 2023-09-03: qty 6000

## 2023-09-03 MED ORDER — ONDANSETRON HCL 4 MG/2ML IJ SOLN
4.0000 mg | Freq: Once | INTRAMUSCULAR | Status: AC
Start: 2023-09-03 — End: 2023-09-03
  Administered 2023-09-03: 4 mg via INTRAVENOUS
  Filled 2023-09-03: qty 2

## 2023-09-03 MED ORDER — CARVEDILOL 3.125 MG PO TABS
3.1250 mg | ORAL_TABLET | Freq: Two times a day (BID) | ORAL | Status: DC
  Administered 2023-09-03 – 2023-09-05 (×4): 3.125 mg via ORAL
  Filled 2023-09-03 (×4): qty 1

## 2023-09-03 MED ORDER — BISACODYL 5 MG PO TBEC: 5.0000 mg | DELAYED_RELEASE_TABLET | Freq: Every day | ORAL | Status: DC | PRN

## 2023-09-03 MED ORDER — SENNOSIDES-DOCUSATE SODIUM 8.6-50 MG PO TABS
1.0000 | ORAL_TABLET | Freq: Every evening | ORAL | Status: DC | PRN
Start: 2023-09-03 — End: 2023-09-05

## 2023-09-03 MED ORDER — ONDANSETRON HCL 4 MG/2ML IJ SOLN: 4.0000 mg | Freq: Four times a day (QID) | INTRAMUSCULAR | Status: DC | PRN

## 2023-09-03 MED ORDER — HYDROMORPHONE HCL 1 MG/ML IJ SOLN
0.5000 mg | INTRAMUSCULAR | Status: DC | PRN
  Administered 2023-09-03 – 2023-09-04 (×4): 0.5 mg via INTRAVENOUS
  Filled 2023-09-03 (×4): qty 0.5

## 2023-09-03 MED ORDER — IOHEXOL 350 MG/ML SOLN
75.0000 mL | Freq: Once | INTRAVENOUS | Status: AC | PRN
  Administered 2023-09-03: 75 mL via INTRAVENOUS

## 2023-09-03 MED ORDER — ONDANSETRON HCL 4 MG PO TABS: 4.0000 mg | ORAL_TABLET | Freq: Four times a day (QID) | ORAL | Status: DC | PRN

## 2023-09-03 MED ORDER — HYDROMORPHONE HCL 1 MG/ML IJ SOLN
1.0000 mg | Freq: Once | INTRAMUSCULAR | Status: AC
Start: 2023-09-03 — End: 2023-09-03
  Administered 2023-09-03: 1 mg via INTRAVENOUS
  Filled 2023-09-03: qty 1

## 2023-09-03 MED ORDER — HEPARIN BOLUS VIA INFUSION
2000.0000 [IU] | Freq: Once | INTRAVENOUS | Status: AC
  Administered 2023-09-03: 2000 [IU] via INTRAVENOUS
  Filled 2023-09-03: qty 2000

## 2023-09-03 MED ORDER — HEPARIN (PORCINE) 25000 UT/250ML-% IV SOLN
2500.0000 [IU]/h | INTRAVENOUS | Status: DC
  Administered 2023-09-03: 1900 [IU]/h via INTRAVENOUS
  Administered 2023-09-04 – 2023-09-05 (×2): 2500 [IU]/h via INTRAVENOUS
  Filled 2023-09-03 (×5): qty 250

## 2023-09-03 MED ORDER — ACETAMINOPHEN 325 MG PO TABS: 650.0000 mg | ORAL_TABLET | Freq: Four times a day (QID) | ORAL | Status: DC | PRN

## 2023-09-03 MED ORDER — ACETAMINOPHEN 650 MG RE SUPP: 650.0000 mg | Freq: Four times a day (QID) | RECTAL | Status: DC | PRN

## 2023-09-03 NOTE — H&P (Signed)
History and Physical    Riaan Skokan NWG:[id number] DOB: 06-Sep-1981 DOA: 09/03/2023  PCP: Rana Snare, DO (Confirm with patient/family/NH records and if not entered, this has to be entered at [facility] point of entry) Patient coming from: Home  I have personally briefly reviewed patient's old medical records in [facility]  Chief Complaint: Chest pain  HPI: Ural Shor is a 42 y.o. male with medical history significant of DVT in 2023 off Xarelto presented with new onset of chest pains.  Patient started to have right leg swelling and pain 10 days ago with mild exertional dyspnea, and came to ED on 10/15, when he was diagnosed with right-sided DVT and patient sent home with loading dose of Eliquis.  Patient reported that he has been compliant with Eliquis loading dose but patient started to have increasing shortness of breath and pressure-like chest pain came to ED. ED Course: Afebrile, borderline tachycardia pulse 100, blood pressure elevated 150/100, breathing rate 26 O2 saturation 100% on room air.  CTA showed, bilateral PE with RV/LV ratio 0.95.  Troponin negative x 2, EKG nonischemic.  Patient was started on heparin drip in the ED.  Review of Systems: As per HPI otherwise 14 point review of systems negative.    Past Medical History:  Diagnosis Date   DVT (deep venous thrombosis) (HCC)    DVT (deep venous thrombosis) (HCC)    Pulmonary embolism (HCC)     History reviewed. No pertinent surgical history.   reports that he has been smoking cigarettes. He has a 0.4 pack-year smoking history. He has never used smokeless tobacco. He reports current alcohol use of about 22.0 standard drinks of alcohol per week. He reports current drug use. Drug: Marijuana.  No Known Allergies  History reviewed. No pertinent family history.   Prior to Admission medications   Medication Sig Start Date End Date Taking? Authorizing Provider  albuterol (PROVENTIL HFA;VENTOLIN HFA) 108 (90 Base)  MCG/ACT inhaler Inhale 2 puffs into the lungs every 6 (six) hours as needed for wheezing or shortness of breath. Patient not taking: Reported on 05/21/2022 02/10/18   Dionne Bucy, MD  apixaban (ELIQUIS) 5 MG TABS tablet Take 1 tablet (5 mg total) by mouth 2 (two) times daily. 08/29/23   Delton Prairie, MD  APIXABAN Everlene Balls) VTE STARTER PACK (10MG  AND 5MG ) Take as directed on package: start with two-5mg  tablets twice daily for 7 days. On day 8, switch to one-5mg  tablet twice daily. 08/29/23   Delton Prairie, MD  APIXABAN Everlene Balls) VTE STARTER PACK (10MG  AND 5MG ) Take as directed on package: start with two-5mg  tablets twice daily for 7 days. On day 8, switch to one-5mg  tablet twice daily. 08/29/23   Delton Prairie, MD  rivaroxaban (XARELTO) 20 MG TABS tablet Take 1 tablet (20 mg total) by mouth daily with supper. 09/14/22   Rana Snare, DO  RIVAROXABAN [name]) VTE STARTER PACK (15 & 20 MG) Follow package directions: Take one 15mg  tablet by mouth twice a day. On day 22, switch to one 20mg  tablet once a day. Take with food. 09/05/22   Gareth Eagle, PA-C  RIVAROXABAN [name]) VTE STARTER PACK (15 & 20 MG) Follow package directions: Take one 15mg  tablet by mouth twice a day. On day 22, switch to one 20mg  tablet once a day. Take with food. 09/05/22   Alvira Monday, MD    Physical Exam: Vitals:   09/03/23 0529 09/03/23 0726  BP: (!) 150/107   Pulse: 100   Resp: (!) 26  Temp: 98 F (36.7 C)   TempSrc: Oral   SpO2: 99%   Weight:  127 kg  Height:  6\' 4"  (1.93 m)    Constitutional: NAD, calm, comfortable Vitals:   09/03/23 0529 09/03/23 0726  BP: (!) 150/107   Pulse: 100   Resp: (!) 26   Temp: 98 F (36.7 C)   TempSrc: Oral   SpO2: 99%   Weight:  127 kg  Height:  6\' 4"  (1.93 m)   Eyes: PERRL, lids and conjunctivae normal ENMT: Mucous membranes are moist. Posterior pharynx clear of any exudate or lesions.Normal dentition.  Neck: normal, supple, no masses, no  thyromegaly Respiratory: clear to auscultation bilaterally, no wheezing, no crackles. Normal respiratory effort. No accessory muscle use.  Cardiovascular: Regular rate and rhythm, no murmurs / rubs / gallops. No extremity edema. 2+ pedal pulses. No carotid bruits.  Abdomen: no tenderness, no masses palpated. No hepatosplenomegaly. Bowel sounds positive.  Musculoskeletal: no clubbing / cyanosis. No joint deformity upper and lower extremities. Good ROM, no contractures. Normal muscle tone.  Skin: no rashes, lesions, ulcers. No induration Neurologic: CN 2-12 grossly intact. Sensation intact, DTR normal. Strength 5/5 in all 4.  Psychiatric: Normal judgment and insight. Alert and oriented x 3. Normal mood.     Labs on Admission: I have personally reviewed following labs and imaging studies  CBC: Recent Labs  Lab 08/28/23 2105 09/03/23 0537  WBC 8.8 11.7*  NEUTROABS 5.1 7.6  HGB 14.2 15.0  HCT 44.1 46.8  MCV 92.8 93.0  PLT 231 252   Basic Metabolic Panel: Recent Labs  Lab 08/28/23 2105 09/03/23 0537  NA 139 137  K 3.8 3.7  CL 103 98  CO2 27 26  GLUCOSE 95 115*  BUN 9 12  CREATININE 1.25* 1.35*  CALCIUM 8.7* 8.9   GFR: Estimated Creatinine Clearance: 101.6 mL/min (A) (by C-G formula based on SCr of 1.35 mg/dL (H)). Liver Function Tests: No results for input(s): "AST", "ALT", "ALKPHOS", "BILITOT", "PROT", "ALBUMIN" in the last 168 hours. No results for input(s): "LIPASE", "AMYLASE" in the last 168 hours. No results for input(s): "AMMONIA" in the last 168 hours. Coagulation Profile: Recent Labs  Lab 08/28/23 2105 09/03/23 0742  INR 0.9 0.9   Cardiac Enzymes: No results for input(s): "CKTOTAL", "CKMB", "CKMBINDEX", "TROPONINI" in the last 168 hours. BNP (last 3 results) No results for input(s): "PROBNP" in the last 8760 hours. HbA1C: No results for input(s): "HGBA1C" in the last 72 hours. CBG: No results for input(s): "GLUCAP" in the last 168 hours. Lipid  Profile: No results for input(s): "CHOL", "HDL", "LDLCALC", "TRIG", "CHOLHDL", "LDLDIRECT" in the last 72 hours. Thyroid Function Tests: No results for input(s): "TSH", "T4TOTAL", "FREET4", "T3FREE", "THYROIDAB" in the last 72 hours. Anemia Panel: No results for input(s): "VITAMINB12", "FOLATE", "FERRITIN", "TIBC", "IRON", "RETICCTPCT" in the last 72 hours. Urine analysis: No results found for: "COLORURINE", "APPEARANCEUR", "LABSPEC", "PHURINE", "GLUCOSEU", "HGBUR", "BILIRUBINUR", "KETONESUR", "PROTEINUR", "UROBILINOGEN", "NITRITE", "LEUKOCYTESUR"  Radiological Exams on Admission: No results found.  EKG: Independently reviewed.  Sinus rhythm, no acute ST changes.  Assessment/Plan Principal Problem:   Pulmonary emboli (HCC) Active Problems:   Bilateral pulmonary embolism (HCC)   Deep vein thrombosis (DVT) of left lower extremity (HCC)  (please populate well all problems here in Problem List. (For example, if patient is on BP meds at home and you resume or decide to hold them, it is a problem that needs to be her. Same for CAD, COPD, HLD and so on)  Bilateral  PE -With some signs of early right heart strain on CT scan.  However patient appears to be hemodynamically stable denied any lightheadedness or blurry vision.  And blood pressure remained stable in the ED stay. -No Eliquis treatment failure considered at this point. -Plan to continue heparin drip for now until chest pain and shortness of breath improves and then can switch back to Eliquis, expect in the next 24 hours. -Echocardiogram to rule out right heart strain -Probably will need lifelong anticoagulation.  Strongly recommend patient outpatient follow-up with hematology for hypercoagulable state workup.  Patient agreed  Right leg DVT -As above  Cigarette smoking -Cessation education performed at bedside  DVT prophylaxis: Heparin drip Code Status: Full code Family Communication: Significant other at bedside Disposition  Plan: Patient is sick with bilateral PE with some early signs of right heart strain requiring parenteral anticoagulation, expect more than 2 midnight hospital stay Consults called: None Admission status: Tele admit   Emeline General MD [facility] Pager [phone]  09/03/2023, 10:54 AM

## 2023-09-03 NOTE — TOC Initial Note (Addendum)
Transition of Care [facility]) - Initial/Assessment Note    Patient Details  Name: Sergio Ramirez MRN: [id number] Date of Birth: March 10, 1981  Transition of Care [facility]) CM/SW Contact:    Marquita Palms, LCSW Phone Number: 09/03/2023, 3:59 PM  Clinical Narrative:                  CSW met with patient at bedside. Patient reported that he has no insurance because he oes not get sick. Patient reports he has had"blood clots since 2018." Patient reports that it "usually does not bother him. Patient received medication for free from the clinician behind the hospital 4 days ago and the medicine is not working. CSW scheduled new patient appointment for patient at [facility] in [place] on !0/30/2024 at 3:20pm. Information giving to patient for appointment.        Patient Goals and CMS Choice            Expected Discharge Plan and Services                                              Prior Living Arrangements/Services                       Activities of Daily Living      Permission Sought/Granted                  Emotional Assessment              Admission diagnosis:  Pulmonary emboli [facility]) [I26.99] Patient Active Problem List   Diagnosis Date Noted   Pulmonary emboli (HCC) 09/03/2023   Tobacco use 09/14/2022   Elevated blood pressure reading 09/14/2022   Deep vein thrombosis (DVT) of left lower extremity (HCC)    Bilateral pulmonary embolism (HCC) 12/03/2017   PCP:  Rana Snare, DO Pharmacy:   [id number] [place], [place] - [address] AT [facility] [address] [place] [address] Phone: [phone] Fax: [phone]  [facility] [address] ([address]), [place] - [address] [place]) [place] [address] Phone: [phone] Fax: [phone]  [facility] [address] [place] [address] Phone:  [phone] Fax: [phone]  [place] [facility] [address] [place] [address] Phone: [phone] Fax: [phone]  [facility] [address] [place] [address] Phone: [phone] Fax: [phone]     Social Determinants of Health (SDOH) Social History: SDOH Screenings   Depression [phone]): Low Risk  (09/14/2022)  Tobacco Use: High Risk (09/03/2023)   SDOH Interventions:     Readmission Risk Interventions     No data to display

## 2023-09-03 NOTE — Consult Note (Signed)
PHARMACY - ANTICOAGULATION CONSULT NOTE  Pharmacy Consult for Heparin Indication: pulmonary embolus  Patient Measurements: Height: 6\' 4"  (193 cm) Weight: 127 kg (279 lb 15.8 oz) IBW/kg (Calculated) : 86.8 Heparin Dosing Weight: 114.1 kg  Labs: Recent Labs    09/03/23 0537 09/03/23 0742 09/03/23 1013 09/03/23 1550  HGB 15.0  --   --   --   HCT 46.8  --   --   --   PLT 252  --   --   --   APTT  --  32 33 52*  LABPROT  --  12.7  --   --   INR  --  0.9  --   --   HEPARINUNFRC  --  >1.10*  --   --   CREATININE 1.35*  --   --   --   TROPONINIHS 7 6  --   --     Estimated Creatinine Clearance: 101.6 mL/min (A) (by C-G formula based on SCr of 1.35 mg/dL (H)).   Medical History: Past Medical History:  Diagnosis Date   DVT (deep venous thrombosis) (HCC)    DVT (deep venous thrombosis) (HCC)    Pulmonary embolism (HCC)     Medications:  --Medication reconciliation is pending --Appears he reports taking apixaban prior to admission per triage notes  Assessment: 42 y/o M with medical history including blood clots and most recently an ED visit on 10/15 for right leg pain and swelling at which time he was diagnosed with a DVT and sent on apixaban. Patient now returning with chest pain and worsening shortness of breath. Pharmacy consulted to initiate and manage heparin infusion for suspected PE.   1021 1550 aPTT 52.   Goal of Therapy:  Heparin level 0.3-0.7 units/ml aPTT 66 - 102 seconds Monitor platelets by anticoagulation protocol: Yes   Plan:  aPTT is subtherapeutic. Will give heparin bolus of 2000 units x 1 and increase heparin infusion to 2100 units/hr. Recheck aPTT in 6 hours. Heparin level and CBC with AM labs. Switch to heparin level monitoring once aPTT and heparin level correlate.   Ronnald Ramp, PharmD, BCPS 09/03/2023,5:07 PM

## 2023-09-03 NOTE — Consult Note (Signed)
PHARMACY - ANTICOAGULATION CONSULT NOTE  Pharmacy Consult for IV Heparin Indication: pulmonary embolus  Patient Measurements: Height: 6\' 4"  (193 cm) Weight: 127 kg (279 lb 15.8 oz) IBW/kg (Calculated) : 86.8 Heparin Dosing Weight: 114.1 kg  Labs: Recent Labs    09/03/23 0537  HGB 15.0  HCT 46.8  PLT 252  CREATININE 1.35*  TROPONINIHS 7    Estimated Creatinine Clearance: 101.6 mL/min (A) (by C-G formula based on SCr of 1.35 mg/dL (H)).   Medical History: Past Medical History:  Diagnosis Date   DVT (deep venous thrombosis) (HCC)    DVT (deep venous thrombosis) (HCC)    Pulmonary embolism (HCC)     Medications:  --Medication reconciliation is pending --Appears he reports taking apixaban prior to admission per triage notes  Assessment: 42 y/o M with medical history including blood clots and most recently an ED visit on 10/15 for right leg pain and swelling at which time he was diagnosed with a DVT and sent on apixaban. Patient now returning with chest pain and worsening shortness of breath. Pharmacy consulted to initiate and manage heparin infusion for suspected PE.   Baseline aPTT, INR, and heparin level are pending. Baseline CBC within normal limits.  Goal of Therapy:  Heparin level 0.3-0.7 units/ml aPTT 66 - 102 seconds Monitor platelets by anticoagulation protocol: Yes   Plan:  --Heparin 6000 unit IV bolus followed by continuous infusion at 1900 units/hr --aPTT 6 hours from initiation of infusion given presumed interference of apixaban on anti-Xa level Will switch over to anti-Xa monitoring once correlation established --Daily CBC per protocol while on IV heparin  Tressie Ellis 09/03/2023,7:35 AM

## 2023-09-03 NOTE — ED Notes (Signed)
Pt given meal tray. Family at bedside. Pt has no other needs at this time

## 2023-09-03 NOTE — ED Provider Notes (Signed)
   [facility] Provider Note  Signout received pending the results of the CT angiogram of the chest.  I spoke with radiologist regarding the radiology findings, bilateral upper and lower lobe pulmonary embolism without signs of right heart strain.  Patient remains hemodynamically stable, comfortable when I reassessed and informed him of these findings.  He has been fully compliant with his Eliquis in the past 1 week after being diagnosed with DVT despite this developed new chest pain/evidence of PE breakthrough clot, initiated IV heparin, consult with hospitalist for admission.  Critical Care performed: Yes, see critical care procedure note(s)  .Critical Care  Performed by: Pilar Jarvis, MD Authorized by: Pilar Jarvis, MD   Critical care provider statement:    Critical care time (minutes):  30   Critical care was time spent personally by me on the following activities:  Development of treatment plan with patient or surrogate, discussions with consultants, evaluation of patient's response to treatment, examination of patient, ordering and review of laboratory studies, ordering and review of radiographic studies, ordering and performing treatments and interventions, pulse oximetry, re-evaluation of patient's condition and review of old charts      Pilar Jarvis, MD 09/03/23 [phone]

## 2023-09-03 NOTE — ED Provider Notes (Signed)
[facility] Provider Note    Event Date/Time   First MD Initiated Contact with Patient 09/03/23 [phone]     (approximate)   History   Chest Pain   HPI  Sergio Ramirez is a 42 y.o. male with history of PE, DVT on Eliquis who presents to the emergency department with right-sided chest pain that started yesterday and worsened tonight.  Reports he has had fevers, cough and shortness of breath.  Reports compliance with his Eliquis.  Diagnosed with occlusive DVT in the right popliteal and peroneal vein on 08/28/2023.   History provided by patient, girlfriend.    Past Medical History:  Diagnosis Date   DVT (deep venous thrombosis) (HCC)    DVT (deep venous thrombosis) (HCC)    Pulmonary embolism (HCC)     No past surgical history on file.  MEDICATIONS:  Prior to Admission medications   Medication Sig Start Date End Date Taking? Authorizing Provider  albuterol (PROVENTIL HFA;VENTOLIN HFA) 108 (90 Base) MCG/ACT inhaler Inhale 2 puffs into the lungs every 6 (six) hours as needed for wheezing or shortness of breath. Patient not taking: Reported on 05/21/2022 02/10/18   Dionne Bucy, MD  apixaban (ELIQUIS) 5 MG TABS tablet Take 1 tablet (5 mg total) by mouth 2 (two) times daily. 08/29/23   Delton Prairie, MD  APIXABAN Everlene Balls) VTE STARTER PACK (10MG  AND 5MG ) Take as directed on package: start with two-5mg  tablets twice daily for 7 days. On day 8, switch to one-5mg  tablet twice daily. 08/29/23   Delton Prairie, MD  APIXABAN Everlene Balls) VTE STARTER PACK (10MG  AND 5MG ) Take as directed on package: start with two-5mg  tablets twice daily for 7 days. On day 8, switch to one-5mg  tablet twice daily. 08/29/23   Delton Prairie, MD  rivaroxaban (XARELTO) 20 MG TABS tablet Take 1 tablet (20 mg total) by mouth daily with supper. 09/14/22   Rana Snare, DO  RIVAROXABAN [name]) VTE STARTER PACK (15 & 20 MG) Follow package directions: Take one 15mg  tablet by mouth twice a day. On  day 22, switch to one 20mg  tablet once a day. Take with food. 09/05/22   Gareth Eagle, PA-C  RIVAROXABAN [name]) VTE STARTER PACK (15 & 20 MG) Follow package directions: Take one 15mg  tablet by mouth twice a day. On day 22, switch to one 20mg  tablet once a day. Take with food. 09/05/22   Alvira Monday, MD    Physical Exam   Triage Vital Signs: ED Triage Vitals  Encounter Vitals Group     BP 09/03/23 0529 (!) 150/107     Systolic BP Percentile --      Diastolic BP Percentile --      Pulse Rate 09/03/23 0529 100     Resp 09/03/23 0529 (!) 26     Temp 09/03/23 0529 98 F (36.7 C)     Temp Source 09/03/23 0529 Oral     SpO2 09/03/23 0529 99 %     Weight --      Height --      Head Circumference --      Peak Flow --      Pain Score 09/03/23 0532 10     Pain Loc --      Pain Education --      Exclude from Growth Chart --     Most recent vital signs: Vitals:   09/03/23 0529  BP: (!) 150/107  Pulse: 100  Resp: (!) 26  Temp: 98 F (36.7 C)  SpO2: 99%    CONSTITUTIONAL: Alert, responds appropriately to questions.  Appears uncomfortable, groaning in pain HEAD: Normocephalic, atraumatic EYES: Conjunctivae clear, pupils appear equal, sclera nonicteric ENT: normal nose; moist mucous membranes NECK: Supple, normal ROM CARD: Regular and tachycardic; S1 and S2 appreciated RESP: Pt is tachypneic; breath sounds clear and equal bilaterally; no wheezes, no rhonchi, no rales, no hypoxia or respiratory distress, speaking full sentences CHEST:  Chest wall is tender to palpation the right anterior chest wall.  No crepitus or deformity. ABD/GI: Non-distended; soft, non-tender, no rebound, no guarding, no peritoneal signs BACK: The back appears normal EXT: Tender to palpation of the right calf but compartments soft.  Extremities warm and well-perfused. SKIN: Normal color for age and race; warm; no rash on exposed skin NEURO: Moves all extremities equally, normal speech PSYCH: The  patient's mood and manner are appropriate.   ED Results / Procedures / Treatments   LABS: (all labs ordered are listed, but only abnormal results are displayed) Labs Reviewed  CBC WITH DIFFERENTIAL/PLATELET - Abnormal; Notable for the following components:      Result Value   WBC 11.7 (*)    All other components within normal limits  BASIC METABOLIC PANEL - Abnormal; Notable for the following components:   Glucose, Bld 115 (*)    Creatinine, Ser 1.35 (*)    All other components within normal limits  RESP PANEL BY RT-PCR (RSV, FLU A&B, COVID)  RVPGX2  TROPONIN I (HIGH SENSITIVITY)     EKG:  EKG Interpretation Date/Time:  Monday September 03 2023 05:28:48 EDT Ventricular Rate:  100 PR Interval:  142 QRS Duration:  82 QT Interval:  338 QTC Calculation: 436 R Axis:   33  Text Interpretation: Normal sinus rhythm Normal ECG When compared with ECG of 21-Nov-2018 17:42, No significant change was found Confirmed by Rochele Raring [phone]) on 09/03/2023 5:40:46 AM         RADIOLOGY: My personal review and interpretation of imaging: CT pending.  I have personally reviewed all radiology reports.   No results found.   PROCEDURES:  Critical Care performed: No   CRITICAL CARE Performed by: Rochele Raring   Total critical care time: 0 minutes  Critical care time was exclusive of separately billable procedures and treating other patients.  Critical care was necessary to treat or prevent imminent or life-threatening deterioration.  Critical care was time spent personally by me on the following activities: development of treatment plan with patient and/or surrogate as well as nursing, discussions with consultants, evaluation of patient's response to treatment, examination of patient, obtaining history from patient or surrogate, ordering and performing treatments and interventions, ordering and review of laboratory studies, ordering and review of radiographic studies, pulse oximetry  and re-evaluation of patient's condition.   Marland Kitchen1-3 Lead EKG Interpretation  Performed by: Glenda Spelman, Layla Maw, DO Authorized by: Joey Lierman, Layla Maw, DO     Interpretation: abnormal     ECG rate:  100   ECG rate assessment: tachycardic     Rhythm: sinus tachycardia     Ectopy: none     Conduction: normal       IMPRESSION / MDM / ASSESSMENT AND PLAN / ED COURSE  I reviewed the triage vital signs and the nursing notes.    Patient here with chest pain.  Recently diagnosed with a DVT on Eliquis.  The patient is on the cardiac monitor to evaluate for evidence of arrhythmia and/or significant heart rate changes.   DIFFERENTIAL DIAGNOSIS (includes but  not limited to):   PE, chest wall pain, less likely ACS, dissection, viral URI, pneumonia   Patient's presentation is most consistent with acute presentation with potential threat to life or bodily function.   PLAN: Will obtain cardiac labs, CTA of the chest.  Will give pain medication.  Given complaints of fever and cough, will obtain COVID and flu swab as well.   MEDICATIONS GIVEN IN ED: Medications  HYDROmorphone (DILAUDID) injection 1 mg (1 mg Intravenous Given 09/03/23 0549)  ondansetron (ZOFRAN) injection 4 mg (4 mg Intravenous Given 09/03/23 0552)  iohexol (OMNIPAQUE) 350 MG/ML injection 75 mL (75 mLs Intravenous Contrast Given 09/03/23 0652)     ED COURSE: Patient's labs show a leukocytosis of 11.7.  Normal hemoglobin.  Creatinine minimally elevated which is stable.  Troponin negative.  No indication for serial enzymes given pain is atypical for ACS and ongoing for a full day.  CTA of the chest pending.  Signed out the oncoming ED physician.     CONSULTS:  pending CTA chest   OUTSIDE RECORDS REVIEWED: Reviewed last admission for acute bilateral PE and left lower extremity DVT in January 2019.       FINAL CLINICAL IMPRESSION(S) / ED DIAGNOSES   Final diagnoses:  Right-sided chest pain     Rx / DC Orders   ED  Discharge Orders     None        Note:  This document was prepared using Dragon voice recognition software and may include unintentional dictation errors.   Charliegh Vasudevan, Layla Maw, DO 09/03/23 [phone]

## 2023-09-03 NOTE — ED Triage Notes (Signed)
Pt presents with right sided rib/chest pain and increasing SOB, started yesterday, "I have dvt's and it feels like a clot broke off, it has happened before". Taking eliquis. Pt not willing to answer any further screening questions, "just yes to everything, get me the hell back"

## 2023-09-04 ENCOUNTER — Inpatient Hospital Stay (HOSPITAL_COMMUNITY)
Admit: 2023-09-04 | Discharge: 2023-09-04 | Disposition: A | Discharge status: Home / Self Care | Payer: BLUE CROSS/BLUE SHIELD | Attending: Internal Medicine | Admitting: Internal Medicine

## 2023-09-04 ENCOUNTER — Encounter: Payer: Self-pay | Admitting: Internal Medicine

## 2023-09-04 DIAGNOSIS — I2609 Other pulmonary embolism with acute cor pulmonale: Secondary | ICD-10-CM | POA: Diagnosis not present

## 2023-09-04 DIAGNOSIS — I2694 Multiple subsegmental pulmonary emboli without acute cor pulmonale: Secondary | ICD-10-CM | POA: Diagnosis not present

## 2023-09-04 DIAGNOSIS — I82432 Acute embolism and thrombosis of left popliteal vein: Secondary | ICD-10-CM | POA: Diagnosis not present

## 2023-09-04 LAB — ECHOCARDIOGRAM COMPLETE
AR max vel: 4.09 cm2
AV Area VTI: 3.99 cm2
AV Area mean vel: 3.63 cm2
AV Mean grad: 3 mm[Hg]
AV Peak grad: 5.4 mm[Hg]
Ao pk vel: 1.16 m/s
Area-P 1/2: 3 cm2
Height: 76 in
MV VTI: 4.52 cm2
S' Lateral: 4 cm
Weight: 4479.75 [oz_av]

## 2023-09-04 LAB — CBC
HCT: 41.2 % (ref 39.0–52.0)
Hemoglobin: 13.2 g/dL (ref 13.0–17.0)
MCH: 29.8 pg (ref 26.0–34.0)
MCHC: 32 g/dL (ref 30.0–36.0)
MCV: 93 fL (ref 80.0–100.0)
Platelets: 215 10*3/uL (ref 150–400)
RBC: 4.43 MIL/uL (ref 4.22–5.81)
RDW: 12.1 % (ref 11.5–15.5)
WBC: 9.9 10*3/uL (ref 4.0–10.5)
nRBC: 0.2 % (ref 0.0–0.2)

## 2023-09-04 LAB — PROCALCITONIN: Procalcitonin: <0.1 ng/mL

## 2023-09-04 LAB — APTT
aPTT: 60 s — ABNORMAL HIGH (ref 24–36)
aPTT: 64 s — ABNORMAL HIGH (ref 24–36)
aPTT: 69 s — ABNORMAL HIGH (ref 24–36)
aPTT: 80 s — ABNORMAL HIGH (ref 24–36)

## 2023-09-04 LAB — HEPARIN LEVEL (UNFRACTIONATED): Heparin Unfractionated: 0.92 [IU]/mL — ABNORMAL HIGH (ref 0.30–0.70)

## 2023-09-04 MED ORDER — HEPARIN BOLUS VIA INFUSION
1700.0000 [IU] | Freq: Once | INTRAVENOUS | Status: AC
  Administered 2023-09-04: 1700 [IU] via INTRAVENOUS
  Filled 2023-09-04: qty 1700

## 2023-09-04 MED ORDER — HEPARIN BOLUS VIA INFUSION
1500.0000 [IU] | Freq: Once | INTRAVENOUS | Status: AC
  Administered 2023-09-04: 1500 [IU] via INTRAVENOUS
  Filled 2023-09-04: qty 1500

## 2023-09-04 NOTE — Plan of Care (Signed)

## 2023-09-04 NOTE — Discharge Instructions (Signed)
Biomedical engineer  (works with formerly incarcerated or justice-involved individuals) [address] [place], Case Manager [phone]

## 2023-09-04 NOTE — Consult Note (Signed)
PHARMACY - ANTICOAGULATION CONSULT NOTE  Pharmacy Consult for Heparin Indication: pulmonary embolus  Patient Measurements: Height: 6\' 4"  (193 cm) Weight: 127 kg (279 lb 15.8 oz) IBW/kg (Calculated) : 86.8 Heparin Dosing Weight: 114.1 kg  Labs: Recent Labs    09/03/23 0537 09/03/23 0742 09/03/23 0742 09/03/23 1013 09/03/23 1550 09/04/23 0000  HGB 15.0  --   --   --   --   --   HCT 46.8  --   --   --   --   --   PLT 252  --   --   --   --   --   APTT  --  32   < > 33 52* 60*  LABPROT  --  12.7  --   --   --   --   INR  --  0.9  --   --   --   --   HEPARINUNFRC  --  >1.10*  --   --   --   --   CREATININE 1.35*  --   --   --   --   --   TROPONINIHS 7 6  --   --   --   --    < > = values in this interval not displayed.    Estimated Creatinine Clearance: 101.6 mL/min (A) (by C-G formula based on SCr of 1.35 mg/dL (H)).   Medical History: Past Medical History:  Diagnosis Date   DVT (deep venous thrombosis) (HCC)    DVT (deep venous thrombosis) (HCC)    Pulmonary embolism (HCC)     Medications:  --Medication reconciliation is pending --Appears he reports taking apixaban prior to admission per triage notes  Assessment: 42 y/o M with medical history including blood clots and most recently an ED visit on 10/15 for right leg pain and swelling at which time he was diagnosed with a DVT and sent on apixaban. Patient now returning with chest pain and worsening shortness of breath. Pharmacy consulted to initiate and manage heparin infusion for suspected PE.   1021 1550 aPTT 52.  1022 0000 aPTT 60  Goal of Therapy:  Heparin level 0.3-0.7 units/ml aPTT 66 - 102 seconds Monitor platelets by anticoagulation protocol: Yes   Plan:  10/22:  aPTT @ 0000 = 60, SUBtherapeutic  - will order Heparin 1700 units IV X 1 bolus and increase drip rate to 2300 units/hr  - will recheck aPTT and HL 6 hrs after rate change  Switch to heparin level monitoring once aPTT and heparin level  correlate.   Asra Gambrel D, PharmD 09/04/2023,12:25 AM

## 2023-09-04 NOTE — Consult Note (Signed)
PHARMACY - ANTICOAGULATION CONSULT NOTE  Pharmacy Consult for Heparin Indication: pulmonary embolus  Patient Measurements: Height: 6\' 4"  (193 cm) Weight: 127 kg (279 lb 15.8 oz) IBW/kg (Calculated) : 86.8 Heparin Dosing Weight: 114.1 kg  Labs: Recent Labs    09/03/23 0537 09/03/23 0742 09/03/23 1013 09/03/23 1550 09/04/23 0000 09/04/23 0556  HGB 15.0  --   --   --   --  13.2  HCT 46.8  --   --   --   --  41.2  PLT 252  --   --   --   --  215  APTT  --  32   < > 52* 60* 80*  LABPROT  --  12.7  --   --   --   --   INR  --  0.9  --   --   --   --   HEPARINUNFRC  --  >1.10*  --   --   --  0.92*  CREATININE 1.35*  --   --   --   --   --   TROPONINIHS 7 6  --   --   --   --    < > = values in this interval not displayed.    Estimated Creatinine Clearance: 101.6 mL/min (A) (by C-G formula based on SCr of 1.35 mg/dL (H)).   Medical History: Past Medical History:  Diagnosis Date   DVT (deep venous thrombosis) (HCC)    DVT (deep venous thrombosis) (HCC)    Pulmonary embolism (HCC)     Medications:  --Medication reconciliation is pending --Appears he reports taking apixaban prior to admission per triage notes  Assessment: 42 y/o M with medical history including blood clots and most recently an ED visit on 10/15 for right leg pain and swelling at which time he was diagnosed with a DVT and sent on apixaban. Patient now returning with chest pain and worsening shortness of breath. Pharmacy consulted to initiate and manage heparin infusion for suspected PE.   1021 1550 aPTT 52.  1022 0000 aPTT 60 1022 0556 aPTT 80,   HL 0.92  Goal of Therapy:  Heparin level 0.3-0.7 units/ml aPTT 66 - 102 seconds Monitor platelets by anticoagulation protocol: Yes   Plan:  10/22 @ 0556:  aPTT = 80,   HL = 0.92 - aPTT therapeutic X 1,  HL still elevated from PTA Eliquis - will continue pt on current rate and recheck aPTT @ 1200     and recheck HL on 10/23 with AM labs - Switch to heparin  level monitoring once aPTT and heparin     level correlate.   Shuntay Everetts D, PharmD 09/04/2023,6:56 AM

## 2023-09-04 NOTE — Consult Note (Signed)
PHARMACY - ANTICOAGULATION CONSULT NOTE  Pharmacy Consult for Heparin Indication: pulmonary embolus  Patient Measurements: Height: 6\' 4"  (193 cm) Weight: 127 kg (279 lb 15.8 oz) IBW/kg (Calculated) : 86.8 Heparin Dosing Weight: 114.1 kg  Labs: Recent Labs    09/03/23 0537 09/03/23 0742 09/03/23 1013 09/04/23 0000 09/04/23 0556 09/04/23 1148  HGB 15.0  --   --   --  13.2  --   HCT 46.8  --   --   --  41.2  --   PLT 252  --   --   --  215  --   APTT  --  32   < > 60* 80* 64*  LABPROT  --  12.7  --   --   --   --   INR  --  0.9  --   --   --   --   HEPARINUNFRC  --  >1.10*  --   --  0.92*  --   CREATININE 1.35*  --   --   --   --   --   TROPONINIHS 7 6  --   --   --   --    < > = values in this interval not displayed.    Estimated Creatinine Clearance: 101.6 mL/min (A) (by C-G formula based on SCr of 1.35 mg/dL (H)).   Medical History: Past Medical History:  Diagnosis Date   DVT (deep venous thrombosis) (HCC)    DVT (deep venous thrombosis) (HCC)    Pulmonary embolism (HCC)     Medications:  --Medication reconciliation is pending --Appears he reports taking apixaban prior to admission per triage notes  Assessment: 42 y/o M with medical history including blood clots and most recently an ED visit on 10/15 for right leg pain and swelling at which time he was diagnosed with a DVT and sent on apixaban. Patient now returning with chest pain and worsening shortness of breath. Pharmacy consulted to initiate and manage heparin infusion for suspected PE.   1021 1550 aPTT 52.  1022 0000 aPTT 60 1022 0556 aPTT 80,   HL 0.92 1022 1148 aPTT 64 sec  Goal of Therapy:  Heparin level 0.3-0.7 units/ml aPTT 66 - 102 seconds Monitor platelets by anticoagulation protocol: Yes   Plan:  aPTT slightly subtherapeutic Give heparin 1500 units IV x 1 Increase heparin infusion rate to 2500 units/hr Recheck aPTT 6 hours after rate change Adjust based on aPTT until correlation with  heparin level Daily CBC while on heparin  Barrie Folk, PharmD 09/04/2023,12:32 PM

## 2023-09-04 NOTE — Progress Notes (Signed)
*  PRELIMINARY RESULTS* Echocardiogram 2D Echocardiogram has been performed.  Carolyne Fiscal 09/04/2023, 1:57 PM

## 2023-09-04 NOTE — Consult Note (Addendum)
PHARMACY - ANTICOAGULATION CONSULT NOTE  Pharmacy Consult for Heparin Indication: pulmonary embolus  Patient Measurements: Height: 6\' 4"  (193 cm) Weight: 127 kg (279 lb 15.8 oz) IBW/kg (Calculated) : 86.8 Heparin Dosing Weight: 114.1 kg  Labs: Recent Labs    09/03/23 0537 09/03/23 0742 09/03/23 1013 09/04/23 0556 09/04/23 1148 09/04/23 1846  HGB 15.0  --   --  13.2  --   --   HCT 46.8  --   --  41.2  --   --   PLT 252  --   --  215  --   --   APTT  --  32   < > 80* 64* 69*  LABPROT  --  12.7  --   --   --   --   INR  --  0.9  --   --   --   --   HEPARINUNFRC  --  >1.10*  --  0.92*  --   --   CREATININE 1.35*  --   --   --   --   --   TROPONINIHS 7 6  --   --   --   --    < > = values in this interval not displayed.    Estimated Creatinine Clearance: 101.6 mL/min (A) (by C-G formula based on SCr of 1.35 mg/dL (H)).   Medical History: Past Medical History:  Diagnosis Date   DVT (deep venous thrombosis) (HCC)    DVT (deep venous thrombosis) (HCC)    Pulmonary embolism (HCC)    Medications:  --Medication reconciliation is pending --Appears he reports taking apixaban prior to admission per triage notes  Assessment: 42 y/o M with medical history including blood clots and most recently an ED visit on 10/15 for right leg pain and swelling at which time he was diagnosed with a DVT and sent on apixaban. Patient now returning with chest pain and worsening shortness of breath. Pharmacy consulted to initiate and manage heparin infusion for suspected PE.   1021 1550 aPTT 52.  1022 0000 aPTT 60 1022 0556 aPTT 80,   HL 0.92 1022 1148 aPTT 64 sec 1022 1846 aPTT 69   Goal of Therapy:  Heparin level 0.3-0.7 units/ml aPTT 66 - 102 seconds Monitor platelets by anticoagulation protocol: Yes   Plan:  aPTT therapeutic x1 Continue heparin infusion rate at 2500 units/hr Check confirmatory aPTT in 6 hours  Adjust based on aPTT until correlation with heparin level Daily CBC while  on heparin  Effie Shy, PharmD Pharmacy Resident  09/04/2023 7:54 PM

## 2023-09-04 NOTE — Progress Notes (Signed)
[facility]  - [place] [facility]   PATIENT NAME: Sergio Ramirez    MR#:  [id number]  DATE OF BIRTH:  Aug 31, 1981  SUBJECTIVE:  no family at bedside. Coughing up clear phlegm with blood streaks. Some right sided chest discomfort. Breathing better. Denies any fever. Has not been taking blood thinners for last couple years.   VITALS:  Blood pressure 122/89, pulse 86, temperature 98.5 F (36.9 C), resp. rate 16, height 6\' 4"  (1.93 m), weight 127 kg, SpO2 97%.  PHYSICAL EXAMINATION:   GENERAL:  42 y.o.-year-old patient with no acute distress. Morbidly obese LUNGS: Normal breath sounds bilaterally, no wheezing CARDIOVASCULAR: S1, S2 normal. No murmur   ABDOMEN: Soft, nontender, nondistended. Bowel sounds present.  EXTREMITIES: No  edema b/l.    NEUROLOGIC: nonfocal  patient is alert and awake SKIN: No obvious rash, lesion, or ulcer.   LABORATORY PANEL:  CBC Recent Labs  Lab 09/04/23 0556  WBC 9.9  HGB 13.2  HCT 41.2  PLT 215    Chemistries  Recent Labs  Lab 09/03/23 0537  NA 137  K 3.7  CL 98  CO2 26  GLUCOSE 115*  BUN 12  CREATININE 1.35*  CALCIUM 8.9   Cardiac Enzymes No results for input(s): "TROPONINI" in the last 168 hours. RADIOLOGY:  CT Angio Chest PE W and/or Wo Contrast  Result Date: 09/03/2023 CLINICAL DATA:  Evaluate for pulmonary embolism. High probability. Complains of right-sided rib and chest pain with progressive shortness of breath. EXAM: CT ANGIOGRAPHY CHEST WITH CONTRAST TECHNIQUE: Multidetector CT imaging of the chest was performed using the standard protocol during bolus administration of intravenous contrast. Multiplanar CT image reconstructions and MIPs were obtained to evaluate the vascular anatomy. RADIATION DOSE REDUCTION: This exam was performed according to the departmental dose-optimization program which includes automated exposure control, adjustment of the mA and/or kV according to patient size and/or use of  iterative reconstruction technique. CONTRAST:  75mL OMNIPAQUE IOHEXOL 350 MG/ML SOLN COMPARISON:  05/21/2022 FINDINGS: Cardiovascular: Satisfactory opacification of the pulmonary arteries to the segmental level.Examination is positive for bilateral pulmonary artery filling defects including the bilateral lower lobe segmental and subsegmental pulmonary arteries, and bilateral upper lobe segmental and subsegmental pulmonary arteries. The RV to LV ratio is equal to 0.95. Heart size is upper limits of normal. No pericardial effusion. Mediastinum/Nodes: Thyroid gland, trachea, and esophagus appear normal. No mediastinal, hilar, or axillary adenopathy. Lungs/Pleura: Small right pleural effusion. Bilateral heterogeneous ground-glass opacities are noted within both lower lobes, lingula and right middle lobe. No pneumothorax identified. Scattered lung nodules are identified. Right middle lobe nodule measures 5 mm, image 58/4. 6 mm subpleural nodule in the left base, image 102/4. Anterior right lower lobe nodule is stable measuring 4 mm, image 72/4. Upper Abdomen: No acute abnormality. Musculoskeletal: No chest wall abnormality. No acute or significant osseous findings. Review of the MIP images confirms the above findings. IMPRESSION: 1. Examination is positive for bilateral pulmonary artery filling defects compatible with acute pulmonary embolus. The RV to LV ratio is equal to 0.95. 2. Small right pleural effusion. 3. Bilateral heterogeneous ground-glass opacities are noted within both lower lobes, lingula and right middle lobe. Findings are nonspecific and may be infectious or inflammatory in etiology. 4. Scattered lung nodules are identified measuring up to 6 mm. Non-contrast chest CT at 3-6 months is recommended. If the nodules are stable at time of repeat CT, then future CT at 18-24 months (from today's scan) is considered optional for low-risk patients, but  is recommended for high-risk patients. This recommendation  follows the consensus statement: Guidelines for Management of Incidental Pulmonary Nodules Detected on CT Images:From the Fleischner Society 2017; published online before print (10.1148/radiol.[id number]). Critical Value/emergent results were called by telephone at the time of interpretation on 09/03/2023 at 7:23 am to provider Dr. Modesto Charon, who verbally acknowledged these results. Electronically Signed   By: Signa Kell M.D.   On: 09/03/2023 07:26    Assessment and Plan  Sergio Ramirez is a 42 y.o. male with medical history significant of DVT in 2023 off Xarelto presented with new onset of chest pains.   Patient started to have right leg swelling and pain 10 days ago with mild exertional dyspnea, and came to ED on 10/15, when he was diagnosed with right-sided DVT and patient sent home with loading dose of Eliquis.  Patient reported that he has been compliant with Eliquis loading dose but patient started to have increasing shortness of breath and pressure-like chest pain came to ED.  Bilateral PE, Acute, recurrent Rigth LE DVT, recurrent --With some signs of early right heart strain on CT scan.   -Plan to continue heparin drip for now until chest pain and shortness of breath improves and then can switch back to Eliquis, expect in the next 24 hours. -Echocardiogram no right heart strain -Pt will need lifelong anticoagulation.  Strongly recommend patient outpatient follow-up with hematology for hypercoagulable state workup.  Patient agreed --Dr Cathie Hoops to see pt  --pt not a candidate for mech thrombectomy given smaller Pulm vessels--Curbsided Vascular --pt will see Pulmonary Dr Larinda Buttery at out pt   Right leg DVT -As above   Cigarette smoking -Cessation education performed at bedside   Family communication :none Consults :none CODE STATUS: FULL DVT Prophylaxis :heparin gtt Level of care: Telemetry Medical Status is: Inpatient Remains inpatient appropriate because: Monitoring 1 more day on  Heparin gtt    TOTAL TIME TAKING CARE OF THIS PATIENT: 35 minutes.  >50% time spent on counselling and coordination of care  Note: This dictation was prepared with Dragon dictation along with smaller phrase technology. Any transcriptional errors that result from this process are unintentional.  Enedina Finner M.D    [facility]   CC: Primary care physician; Rana Snare, DO

## 2023-09-04 NOTE — ED Notes (Signed)
Pt given drink at this time 

## 2023-09-05 DIAGNOSIS — I2699 Other pulmonary embolism without acute cor pulmonale: Secondary | ICD-10-CM | POA: Diagnosis not present

## 2023-09-05 LAB — CBC
HCT: 40.1 % (ref 39.0–52.0)
Hemoglobin: 13.1 g/dL (ref 13.0–17.0)
MCH: 29.9 pg (ref 26.0–34.0)
MCHC: 32.7 g/dL (ref 30.0–36.0)
MCV: 91.6 fL (ref 80.0–100.0)
Platelets: 245 10*3/uL (ref 150–400)
RBC: 4.38 MIL/uL (ref 4.22–5.81)
RDW: 11.9 % (ref 11.5–15.5)
WBC: 6.5 10*3/uL (ref 4.0–10.5)
nRBC: 0 % (ref 0.0–0.2)

## 2023-09-05 LAB — HEPARIN LEVEL (UNFRACTIONATED): Heparin Unfractionated: 0.49 [IU]/mL (ref 0.30–0.70)

## 2023-09-05 LAB — APTT: aPTT: 80 s — ABNORMAL HIGH (ref 24–36)

## 2023-09-05 MED ORDER — APIXABAN 5 MG PO TABS
10.0000 mg | ORAL_TABLET | Freq: Two times a day (BID) | ORAL | Status: DC
  Administered 2023-09-05: 10 mg via ORAL
  Filled 2023-09-05: qty 2

## 2023-09-05 MED ORDER — APIXABAN 5 MG PO TABS: ORAL_TABLET | ORAL | 3 refills | Status: AC

## 2023-09-05 MED ORDER — APIXABAN 5 MG PO TABS: 5.0000 mg | ORAL_TABLET | Freq: Two times a day (BID) | ORAL | Status: DC

## 2023-09-05 NOTE — Discharge Summary (Signed)
Physician Discharge Summary   Patient: Sergio Ramirez MRN: [id number] DOB: 12/27/1980  Admit date:     09/03/2023  Discharge date: 09/05/23  Discharge Physician: Enedina Finner   PCP: Rana Snare, DO   Recommendations at discharge:    F/u Dr Robbie Lis on your appt F/u Dr Cathie Hoops at the cancer center on the appt taht will be made by Cancer center F/u PCP in 1-2 weeks   Discharge Diagnoses: Principal Problem:   Pulmonary emboli [facility]) Active Problems:   Bilateral pulmonary embolism (HCC)   Deep vein thrombosis (DVT) of left lower extremity (HCC)  Sergio Ramirez is a 42 y.o. male with medical history significant of DVT in 2023 off Xarelto presented with new onset of chest pains.   Patient started to have right leg swelling and pain 10 days ago with mild exertional dyspnea, and came to ED on 10/15, when he was diagnosed with right-sided DVT and patient sent home with loading dose of Eliquis.  Patient reported that he has been compliant with Eliquis loading dose but patient started to have increasing shortness of breath and pressure-like chest pain came to ED.   Bilateral PE, Acute, recurrent Rigth LE DVT, recurrent --With some signs of early right heart strain on CT scan.   -Plan to continue heparin drip for now until chest pain and shortness of breath improves and then can switch back to Eliquis, expect in the next 24 hours. -Echocardiogram no right heart strain -Pt will need lifelong anticoagulation.  Strongly recommend patient outpatient follow-up with hematology for hypercoagulable state workup.  Patient agreed --Dr Cathie Hoops to see pt  --pt not a candidate for mech thrombectomy given smaller Pulm vessels--Curbsided Vascular --pt will see Pulmonary Dr Larinda Buttery at out pt --change to po eliquis. Rx sent --overall stable   Right leg DVT -As above   Cigarette smoking -Cessation education performed at bedside  D/c home--pt agreeable     Family communication :none Consults  :none CODE STATUS: FULL      Diet recommendation:  Discharge Diet Orders (From admission, onward)     Start     Ordered   09/05/23 0000  Diet - low sodium heart healthy        09/05/23 1020            DISCHARGE MEDICATION: Allergies as of 09/05/2023   No Known Allergies      Medication List     STOP taking these medications    albuterol 108 (90 Base) MCG/ACT inhaler Commonly known as: VENTOLIN HFA   rivaroxaban 20 MG Tabs tablet Commonly known as: XARELTO   Rivaroxaban Starter Pack (15 mg and 20 mg) Commonly known as: XARELTO STARTER PACK   Xarelto Starter Pack Generic drug: Rivaroxaban Starter Pack (15 mg and 20 mg)       TAKE these medications    apixaban 5 MG Tabs tablet Commonly known as: ELIQUIS Take 2 tablets (10 mg total) by mouth 2 (two) times daily for 7 days, THEN 1 tablet (5 mg total) 2 (two) times daily. Start taking on: September 05, 2023 What changed:  See the new instructions. Another medication with the same name was removed. Continue taking this medication, and follow the directions you see here.        Follow-up Information     [facility] Go on 09/12/2023.   Why: This is a new PCP appointment set up by LCSW in the ER. Please arrive at 3pm for your appointment. Contact information: [phone]  [address] [place] [address] [phone]         Janann Colonel, MD. Go on 09/17/2023.   Specialty: Pulmonary Disease Why: at 2:30 pm PE f/u Contact information: [address] [place] [address] [phone]         Gilda Crease, Latina Craver, MD. Go on 09/10/2023.   Specialties: Vascular Surgery, Cardiology, Radiology, Vascular Surgery Why: at 11;15 am Contact information: [address] [place] [address] [phone]         Rickard Patience, MD. Go to.   Specialty: Oncology Why: on the app that will be called Contact information: [address] [place]  [place] [address] [phone]                Discharge Exam: Sergio Ramirez Weights   09/03/23 0726  Weight: 127 kg  GENERAL:  42 y.o.-year-old patient with no acute distress. Morbidly obese LUNGS: Normal breath sounds bilaterally, no wheezing CARDIOVASCULAR: S1, S2 normal. No murmur   ABDOMEN: Soft, nontender, nondistended. EXTREMITIES: No  edema b/l.    NEUROLOGIC: nonfocal  patient is alert and awake   Condition at discharge: fair  The results of significant diagnostics from this hospitalization (including imaging, microbiology, ancillary and laboratory) are listed below for reference.   Imaging Studies: ECHOCARDIOGRAM COMPLETE  Result Date: 09/04/2023    ECHOCARDIOGRAM REPORT   Patient Name:   Sergio Ramirez Date of Exam: 09/04/2023 Medical Rec #:  [id number]       Height:       76.0 in Accession #:    [id number]      Weight:       280.0 lb Date of Birth:  [birth year]       BSA:          2.556 m Patient Age:    42 years        BP:           117/74 mmHg Patient Gender: M               HR:           82 bpm. Exam Location:  [facility] Procedure: 2D Echo, Cardiac Doppler and Color Doppler Indications:     Pulmonary embolus  History:         Patient has no prior history of Echocardiogram examinations.                  Pulmonary embolus.  Sonographer:     Mikki Harbor Referring Phys:  [id number] Emeline General Diagnosing Phys: Yvonne Kendall MD IMPRESSIONS  1. Left ventricular ejection fraction, by estimation, is 55 to 60%. The left ventricle has normal function. The left ventricle has no regional wall motion abnormalities. Left ventricular diastolic parameters were normal.  2. Right ventricular systolic function is normal. The right ventricular size is mildly enlarged.  3. The mitral valve is normal in structure. Trivial mitral valve regurgitation. No evidence of mitral stenosis.  4. The aortic valve is tricuspid. Aortic valve regurgitation is not visualized. No aortic stenosis is present.  5. The inferior  vena cava is normal in size with greater than 50% respiratory variability, suggesting right atrial pressure of 3 mmHg. FINDINGS  Left Ventricle: Left ventricular ejection fraction, by estimation, is 55 to 60%. The left ventricle has normal function. The left ventricle has no regional wall motion abnormalities. The left ventricular internal cavity size was normal in size. There is  no left ventricular hypertrophy.  Left ventricular diastolic parameters were normal. Right Ventricle: The right ventricular size is mildly enlarged. No increase in right ventricular wall thickness. Right ventricular systolic function is normal. Left Atrium: Left atrial size was normal in size. Right Atrium: Right atrial size was normal in size. Pericardium: There is no evidence of pericardial effusion. Mitral Valve: The mitral valve is normal in structure. Trivial mitral valve regurgitation. No evidence of mitral valve stenosis. MV peak gradient, 2.6 mmHg. The mean mitral valve gradient is 1.0 mmHg. Tricuspid Valve: The tricuspid valve is normal in structure. Tricuspid valve regurgitation is not demonstrated. Aortic Valve: The aortic valve is tricuspid. Aortic valve regurgitation is not visualized. No aortic stenosis is present. Aortic valve mean gradient measures 3.0 mmHg. Aortic valve peak gradient measures 5.4 mmHg. Aortic valve area, by VTI measures 3.99 cm. Pulmonic Valve: The pulmonic valve was normal in structure. Pulmonic valve regurgitation is not visualized. No evidence of pulmonic stenosis. Aorta: The aortic root is normal in size and structure. Pulmonary Artery: The pulmonary artery is of normal size. Venous: The inferior vena cava is normal in size with greater than 50% respiratory variability, suggesting right atrial pressure of 3 mmHg. IAS/Shunts: The interatrial septum was not well visualized.  LEFT VENTRICLE PLAX 2D LVIDd:         5.40 cm   Diastology LVIDs:         4.00 cm   LV e' medial:    7.40 cm/s LV PW:         0.91  cm   LV E/e' medial:  7.6 LV IVS:        1.03 cm   LV e' lateral:   12.20 cm/s LVOT diam:     2.40 cm   LV E/e' lateral: 4.6 LV SV:         90 LV SV Index:   35 LVOT Area:     4.52 cm  RIGHT VENTRICLE RV Basal diam:  4.00 cm RV Mid diam:    3.30 cm RV S prime:     13.50 cm/s TAPSE (M-mode): 2.6 cm LEFT ATRIUM             Index        RIGHT ATRIUM           Index LA diam:        4.10 cm 1.60 cm/m   RA Area:     19.70 cm LA Vol (A2C):   74.1 ml 28.99 ml/m  RA Volume:   56.10 ml  21.95 ml/m LA Vol (A4C):   69.1 ml 27.04 ml/m LA Biplane Vol: 72.1 ml 28.21 ml/m  AORTIC VALVE                    PULMONIC VALVE AV Area (Vmax):    4.09 cm     PV Vmax:       1.25 m/s AV Area (Vmean):   3.63 cm     PV Peak grad:  6.2 mmHg AV Area (VTI):     3.99 cm AV Vmax:           116.00 cm/s AV Vmean:          78.200 cm/s AV VTI:            0.227 m AV Peak Grad:      5.4 mmHg AV Mean Grad:      3.0 mmHg LVOT Vmax:         105.00 cm/s LVOT Vmean:  62.700 cm/s LVOT VTI:          0.200 m LVOT/AV VTI ratio: 0.88  AORTA Ao Root diam: 3.30 cm MITRAL VALVE MV Area (PHT): 3.00 cm    SHUNTS MV Area VTI:   4.52 cm    Systemic VTI:  0.20 m MV Peak grad:  2.6 mmHg    Systemic Diam: 2.40 cm MV Mean grad:  1.0 mmHg MV Vmax:       0.81 m/s MV Vmean:      49.3 cm/s MV Decel Time: 253 msec MV E velocity: 56.20 cm/s MV A velocity: 55.50 cm/s MV E/A ratio:  1.01 Yvonne Kendall MD Electronically signed by Yvonne Kendall MD Signature Date/Time: 09/04/2023/3:53:54 PM    Final    CT Angio Chest PE W and/or Wo Contrast  Result Date: 09/03/2023 CLINICAL DATA:  Evaluate for pulmonary embolism. High probability. Complains of right-sided rib and chest pain with progressive shortness of breath. EXAM: CT ANGIOGRAPHY CHEST WITH CONTRAST TECHNIQUE: Multidetector CT imaging of the chest was performed using the standard protocol during bolus administration of intravenous contrast. Multiplanar CT image reconstructions and MIPs were obtained to  evaluate the vascular anatomy. RADIATION DOSE REDUCTION: This exam was performed according to the departmental dose-optimization program which includes automated exposure control, adjustment of the mA and/or kV according to patient size and/or use of iterative reconstruction technique. CONTRAST:  75mL OMNIPAQUE IOHEXOL 350 MG/ML SOLN COMPARISON:  05/21/2022 FINDINGS: Cardiovascular: Satisfactory opacification of the pulmonary arteries to the segmental level.Examination is positive for bilateral pulmonary artery filling defects including the bilateral lower lobe segmental and subsegmental pulmonary arteries, and bilateral upper lobe segmental and subsegmental pulmonary arteries. The RV to LV ratio is equal to 0.95. Heart size is upper limits of normal. No pericardial effusion. Mediastinum/Nodes: Thyroid gland, trachea, and esophagus appear normal. No mediastinal, hilar, or axillary adenopathy. Lungs/Pleura: Small right pleural effusion. Bilateral heterogeneous ground-glass opacities are noted within both lower lobes, lingula and right middle lobe. No pneumothorax identified. Scattered lung nodules are identified. Right middle lobe nodule measures 5 mm, image 58/4. 6 mm subpleural nodule in the left base, image 102/4. Anterior right lower lobe nodule is stable measuring 4 mm, image 72/4. Upper Abdomen: No acute abnormality. Musculoskeletal: No chest wall abnormality. No acute or significant osseous findings. Review of the MIP images confirms the above findings. IMPRESSION: 1. Examination is positive for bilateral pulmonary artery filling defects compatible with acute pulmonary embolus. The RV to LV ratio is equal to 0.95. 2. Small right pleural effusion. 3. Bilateral heterogeneous ground-glass opacities are noted within both lower lobes, lingula and right middle lobe. Findings are nonspecific and may be infectious or inflammatory in etiology. 4. Scattered lung nodules are identified measuring up to 6 mm. Non-contrast  chest CT at 3-6 months is recommended. If the nodules are stable at time of repeat CT, then future CT at 18-24 months (from today's scan) is considered optional for low-risk patients, but is recommended for high-risk patients. This recommendation follows the consensus statement: Guidelines for Management of Incidental Pulmonary Nodules Detected on CT Images:From the Fleischner Society 2017; published online before print (10.1148/radiol.7829562130). Critical Value/emergent results were called by telephone at the time of interpretation on 09/03/2023 at 7:23 am to provider Dr. Modesto Charon, who verbally acknowledged these results. Electronically Signed   By: Signa Kell M.D.   On: 09/03/2023 07:26   US Venous Img Lower Unilateral Right  Result Date: 08/29/2023 CLINICAL DATA:  Right calf pain, swelling EXAM: RIGHT LOWER EXTREMITY  VENOUS DOPPLER ULTRASOUND TECHNIQUE: Gray-scale sonography with graded compression, as well as color Doppler and duplex ultrasound were performed to evaluate the lower extremity deep venous systems from the level of the common femoral vein and including the common femoral, femoral, profunda femoral, popliteal and calf veins including the posterior tibial, peroneal and gastrocnemius veins when visible. The superficial great saphenous vein was also interrogated. Spectral Doppler was utilized to evaluate flow at rest and with distal augmentation maneuvers in the common femoral, femoral and popliteal veins. COMPARISON:  None Available. FINDINGS: Contralateral Common Femoral Vein: Respiratory phasicity is normal and symmetric with the symptomatic side. No evidence of thrombus. Normal compressibility. Common Femoral Vein: No evidence of thrombus. Normal compressibility, respiratory phasicity and response to augmentation. Saphenofemoral Junction: No evidence of thrombus. Normal compressibility and flow on color Doppler imaging. Profunda Femoral Vein: No evidence of thrombus. Normal compressibility and  flow on color Doppler imaging. Femoral Vein: No evidence of thrombus. Normal compressibility, respiratory phasicity and response to augmentation. Popliteal Vein: Occlusive thrombus noted in the distal right popliteal vein. Calf Veins: Occlusive thrombus in 1 of the paired peroneal veins. Superficial Great Saphenous Vein: No evidence of thrombus. Normal compressibility. Venous Reflux:  None. Other Findings:  None. IMPRESSION: Occlusive DVT in the right popliteal vein and peroneal vein. Electronically Signed   By: Charlett Nose M.D.   On: 08/29/2023 02:06    Microbiology: Results for orders placed or performed during the hospital encounter of 09/03/23  Resp panel by RT-PCR (RSV, Flu A&B, Covid) Anterior Nasal Swab     Status: None   Collection Time: 09/03/23  5:52 AM   Specimen: Anterior Nasal Swab  Result Value Ref Range Status   SARS Coronavirus 2 by RT PCR NEGATIVE NEGATIVE Final    Comment: (NOTE) SARS-CoV-2 target nucleic acids are NOT DETECTED.  The SARS-CoV-2 RNA is generally detectable in upper respiratory specimens during the acute phase of infection. The lowest concentration of SARS-CoV-2 viral copies this assay can detect is 138 copies/mL. A negative result does not preclude SARS-Cov-2 infection and should not be used as the sole basis for treatment or other patient management decisions. A negative result may occur with  improper specimen collection/handling, submission of specimen other than nasopharyngeal swab, presence of viral mutation(s) within the areas targeted by this assay, and inadequate number of viral copies(<138 copies/mL). A negative result must be combined with clinical observations, patient history, and epidemiological information. The expected result is Negative.  Fact Sheet for Patients:  BloggerCourse.com  Fact Sheet for Healthcare Providers:  SeriousBroker.it  This test is no t yet approved or cleared by the  [place] FDA and  has been authorized for detection and/or diagnosis of SARS-CoV-2 by FDA under an Emergency Use Authorization (EUA). This EUA will remain  in effect (meaning this test can be used) for the duration of the COVID-19 declaration under Section 564(b)(1) of the Act, 21 U.S.C.section 360bbb-3(b)(1), unless the authorization is terminated  or revoked sooner.       Influenza A by PCR NEGATIVE NEGATIVE Final   Influenza B by PCR NEGATIVE NEGATIVE Final    Comment: (NOTE) The Xpert Xpress SARS-CoV-2/FLU/RSV plus assay is intended as an aid in the diagnosis of influenza from Nasopharyngeal swab specimens and should not be used as a sole basis for treatment. Nasal washings and aspirates are unacceptable for Xpert Xpress SARS-CoV-2/FLU/RSV testing.  Fact Sheet for Patients: BloggerCourse.com  Fact Sheet for Healthcare Providers: SeriousBroker.it  This test is not yet approved or  cleared by the [place] and has been authorized for detection and/or diagnosis of SARS-CoV-2 by FDA under an Emergency Use Authorization (EUA). This EUA will remain in effect (meaning this test can be used) for the duration of the COVID-19 declaration under Section 564(b)(1) of the Act, 21 U.S.C. section 360bbb-3(b)(1), unless the authorization is terminated or revoked.     Resp Syncytial Virus by PCR NEGATIVE NEGATIVE Final    Comment: (NOTE) Fact Sheet for Patients: BloggerCourse.com  Fact Sheet for Healthcare Providers: SeriousBroker.it  This test is not yet approved or cleared by the [place] FDA and has been authorized for detection and/or diagnosis of SARS-CoV-2 by FDA under an Emergency Use Authorization (EUA). This EUA will remain in effect (meaning this test can be used) for the duration of the COVID-19 declaration under Section 564(b)(1) of the Act, 21 U.S.C. section  360bbb-3(b)(1), unless the authorization is terminated or revoked.  Performed at [facility], [address] [place], [place] [address]     Labs: CBC: Recent Labs  Lab 09/03/23 0537 09/04/23 0556 09/05/23 0558  WBC 11.7* 9.9 6.5  NEUTROABS 7.6  --   --   HGB 15.0 13.2 13.1  HCT 46.8 41.2 40.1  MCV 93.0 93.0 91.6  PLT 252 215 245   Basic Metabolic Panel: Recent Labs  Lab 09/03/23 0537  NA 137  K 3.7  CL 98  CO2 26  GLUCOSE 115*  BUN 12  CREATININE 1.35*  CALCIUM 8.9    Discharge time spent: greater than 30 minutes.  Signed: Enedina Finner, MD [facility] 09/05/2023

## 2023-09-05 NOTE — Consult Note (Signed)
PHARMACY - ANTICOAGULATION CONSULT NOTE  Pharmacy Consult for Heparin Indication: pulmonary embolus  Patient Measurements: Height: 6\' 4"  (193 cm) Weight: 127 kg (279 lb 15.8 oz) IBW/kg (Calculated) : 86.8 Heparin Dosing Weight: 114.1 kg  Labs: Recent Labs    09/03/23 0537 09/03/23 0742 09/03/23 1013 09/04/23 0556 09/04/23 1148 09/04/23 1846 09/05/23 0055  HGB 15.0  --   --  13.2  --   --   --   HCT 46.8  --   --  41.2  --   --   --   PLT 252  --   --  215  --   --   --   APTT  --  32   < > 80* 64* 69* 80*  LABPROT  --  12.7  --   --   --   --   --   INR  --  0.9  --   --   --   --   --   HEPARINUNFRC  --  >1.10*  --  0.92*  --   --  0.49  CREATININE 1.35*  --   --   --   --   --   --   TROPONINIHS 7 6  --   --   --   --   --    < > = values in this interval not displayed.    Estimated Creatinine Clearance: 101.6 mL/min (A) (by C-G formula based on SCr of 1.35 mg/dL (H)).   Medical History: Past Medical History:  Diagnosis Date   DVT (deep venous thrombosis) (HCC)    DVT (deep venous thrombosis) (HCC)    Pulmonary embolism (HCC)    Medications:  --Medication reconciliation is pending --Appears he reports taking apixaban prior to admission per triage notes  Assessment: 42 y/o M with medical history including blood clots and most recently an ED visit on 10/15 for right leg pain and swelling at which time he was diagnosed with a DVT and sent on apixaban. Patient now returning with chest pain and worsening shortness of breath. Pharmacy consulted to initiate and manage heparin infusion for suspected PE.   1021 1550 aPTT 52.  1022 0000 aPTT 60 1022 0556 aPTT 80,   HL 0.92 1022 1148 aPTT 64 sec 1022 1846 aPTT 69  1023 0055 aPTT 80,   HL 0.49   Goal of Therapy:  Heparin level 0.3-0.7 units/ml aPTT 66 - 102 seconds Monitor platelets by anticoagulation protocol: Yes   Plan:  10/23 @ 0055:  aPTT = 80,  HL = 0.49 - aPTT is therapeutic X 2, now correlating with HL   - will use HL to guide dosing from here on - will continue pt on current rate and recheck HL on 10/24 with AM labs Daily CBC while on heparin  Cardelia Sassano D, PharmD 09/05/2023 2:48 AM

## 2023-09-06 ENCOUNTER — Telehealth: Payer: Self-pay

## 2023-09-06 NOTE — Transitions of Care (Post Inpatient/ED Visit) (Signed)
   09/06/2023  Name: Sergio Ramirez MRN: [id number] DOB: [birth year]  Today's TOC FU Call Status: Today's TOC FU Call Status:: Unsuccessful Call (1st Attempt) Unsuccessful Call (1st Attempt) Date: 09/06/23  Attempted to reach the patient regarding the most recent Inpatient/ED visit.  Follow Up Plan: Additional outreach attempts will be made to reach the patient to complete the Transitions of Care (Post Inpatient/ED visit) call.    Jerett Odonohue, CMA  [facility] Team Direct Dial: [phone]

## 2023-09-10 ENCOUNTER — Ambulatory Visit (INDEPENDENT_AMBULATORY_CARE_PROVIDER_SITE_OTHER): Payer: Self-pay | Admitting: Vascular Surgery

## 2023-09-11 NOTE — Transitions of Care (Post Inpatient/ED Visit) (Signed)
   09/11/2023  Name: Sergio Ramirez MRN: [id number] DOB: 09-16-1981  Today's TOC FU Call Status: Today's TOC FU Call Status:: Unsuccessful Call (2nd Attempt) Unsuccessful Call (1st Attempt) Date: 09/06/23 Unsuccessful Call (2nd Attempt) Date: 09/11/23  Attempted to reach the patient regarding the most recent Inpatient/ED visit.  Follow Up Plan: Additional outreach attempts will be made to reach the patient to complete the Transitions of Care (Post Inpatient/ED visit) call.    Willamae Demby, CMA  [facility] Team Direct Dial: [phone]

## 2023-09-14 ENCOUNTER — Telehealth: Payer: Self-pay

## 2023-09-17 ENCOUNTER — Institutional Professional Consult (permissible substitution): Payer: Self-pay | Admitting: Pulmonary Disease

## 2023-09-25 ENCOUNTER — Telehealth: Payer: Self-pay

## 2023-09-25 ENCOUNTER — Encounter: Payer: Self-pay | Admitting: Pulmonary Disease

## 2023-10-05 ENCOUNTER — Telehealth: Payer: Self-pay

## 2024-04-01 ENCOUNTER — Encounter (INDEPENDENT_AMBULATORY_CARE_PROVIDER_SITE_OTHER): Payer: Self-pay
